# Patient Record
Sex: Female | Born: 1975 | Race: White | Hispanic: No | Marital: Married | Smoking: Never smoker
Health system: Southern US, Community
[De-identification: ages and names within clinical notes are randomized; demographics above are authoritative.]

## PROBLEM LIST (undated history)

## (undated) DIAGNOSIS — D6851 Activated protein C resistance: Secondary | ICD-10-CM

## (undated) DIAGNOSIS — I82409 Acute embolism and thrombosis of unspecified deep veins of unspecified lower extremity: Secondary | ICD-10-CM

## (undated) DIAGNOSIS — Z86018 Personal history of other benign neoplasm: Secondary | ICD-10-CM

## (undated) DIAGNOSIS — K219 Gastro-esophageal reflux disease without esophagitis: Secondary | ICD-10-CM

## (undated) DIAGNOSIS — N879 Dysplasia of cervix uteri, unspecified: Secondary | ICD-10-CM

## (undated) HISTORY — DX: Activated protein C resistance: D68.51

## (undated) HISTORY — PX: WISDOM TOOTH EXTRACTION: SHX21

## (undated) HISTORY — DX: Personal history of other benign neoplasm: Z86.018

## (undated) HISTORY — DX: Acute embolism and thrombosis of unspecified deep veins of unspecified lower extremity: I82.409

## (undated) HISTORY — PX: COLPOSCOPY: SHX161

## (undated) HISTORY — DX: Gastro-esophageal reflux disease without esophagitis: K21.9

## (undated) HISTORY — PX: TONSILLECTOMY: SUR1361

## (undated) HISTORY — PX: KNEE SURGERY: SHX244

## (undated) HISTORY — DX: Dysplasia of cervix uteri, unspecified: N87.9

---
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
## 1997-12-21 ENCOUNTER — Ambulatory Visit (HOSPITAL_COMMUNITY): Admission: RE | Admit: 1997-12-21 | Discharge: 1997-12-21 | Payer: Self-pay | Admitting: Family Medicine

## 1997-12-21 ENCOUNTER — Encounter: Payer: Self-pay | Admitting: Family Medicine

## 2000-02-17 HISTORY — PX: COLPOSCOPY: SHX161

## 2016-05-11 DIAGNOSIS — D485 Neoplasm of uncertain behavior of skin: Secondary | ICD-10-CM | POA: Diagnosis not present

## 2016-05-11 DIAGNOSIS — C4490 Unspecified malignant neoplasm of skin, unspecified: Secondary | ICD-10-CM | POA: Diagnosis not present

## 2016-11-23 DIAGNOSIS — D229 Melanocytic nevi, unspecified: Secondary | ICD-10-CM | POA: Diagnosis not present

## 2016-11-23 DIAGNOSIS — D485 Neoplasm of uncertain behavior of skin: Secondary | ICD-10-CM | POA: Diagnosis not present

## 2017-01-09 ENCOUNTER — Other Ambulatory Visit: Payer: Self-pay

## 2017-01-09 ENCOUNTER — Ambulatory Visit
Admission: EM | Admit: 2017-01-09 | Discharge: 2017-01-09 | Disposition: A | Discharge status: Home / Self Care | Payer: BLUE CROSS/BLUE SHIELD

## 2017-01-09 DIAGNOSIS — L500 Allergic urticaria: Secondary | ICD-10-CM | POA: Diagnosis not present

## 2017-01-09 DIAGNOSIS — T7840XA Allergy, unspecified, initial encounter: Secondary | ICD-10-CM

## 2017-01-09 DIAGNOSIS — L509 Urticaria, unspecified: Secondary | ICD-10-CM

## 2017-01-09 MED ORDER — DEXAMETHASONE SODIUM PHOSPHATE 10 MG/ML IJ SOLN
10.0000 mg | Freq: Once | INTRAMUSCULAR | Status: AC
  Administered 2017-01-09: 10 mg via INTRAMUSCULAR

## 2017-01-09 MED ORDER — PREDNISONE 10 MG PO TABS: 10.0000 mg | ORAL_TABLET | Freq: Every day | ORAL | 0 refills | Status: DC

## 2017-01-09 NOTE — ED Provider Notes (Signed)
[facility]    CSN: [id number] Arrival date & time: 01/09/17  0813     History   Chief Complaint Chief Complaint  Patient presents with  . Allergic Reaction    HPI Jenna Mccarty is a 41 y.o. female 41 year old female presents to the urgent care facility for evaluation of allergic reaction.  Patient states 2 days ago she drank a new flavor of protein shake and developed instant hives on her forehead, cheeks neck and upper extremities.  She also developed more mild rash on the abdomen and lower back.  She denies any facial swelling, difficulty swallowing, chest tightness, wheezing.  No history of anaphylaxis.  Patient has been treating with Benadryl, Zantac but with mild improvement.  She has seen some improvement of the rash on her extremities but not so much on her face.  Rash is not painful, there is mild itching present.   HPI  History reviewed. No pertinent past medical history.  There are no active problems to display for this patient.   Past Surgical History:  Procedure Laterality Date  . KNEE SURGERY    . TONSILLECTOMY      OB History    No data available       Home Medications    Prior to Admission medications   Medication Sig Start Date End Date Taking? Authorizing Provider  norgestimate-ethinyl estradiol (ORTHO-CYCLEN,SPRINTEC,PREVIFEM) 0.25-35 MG-MCG tablet Take 1 tablet by mouth daily.   Yes [provider]  phentermine 30 MG capsule Take 30 mg by mouth every morning.   Yes [provider]  predniSONE (DELTASONE) 10 MG tablet Take 1 tablet (10 mg total) by mouth daily. 6,5,4,3,2,1 six day taper 01/09/17   Jenna Mccarty    Family History History reviewed. No pertinent family history.  Social History Social History   Tobacco Use  . Smoking status: Never Smoker  . Smokeless tobacco: Never Used  Substance Use Topics  . Alcohol use: Yes    Comment: social  . Drug use: No     Allergies    Penicillins   Review of Systems Review of Systems  Constitutional: Negative for fever.  HENT: Negative for drooling, facial swelling, sore throat, trouble swallowing and voice change.   Respiratory: Negative for shortness of breath, wheezing and stridor.   Cardiovascular: Negative for chest pain.  Gastrointestinal: Negative for abdominal pain.  Skin: Positive for rash.  Neurological: Negative for dizziness and headaches.     Physical Exam Triage Vital Signs ED Triage Vitals [01/09/17 0827]  Enc Vitals Group     BP 130/81     Pulse Rate 91     Resp 16     Temp 98.4 F (36.9 C)     Temp Source Oral     SpO2 98 %     Weight 190 lb (86.2 kg)     Height 5\' 5"  (1.651 m)     Head Circumference      Peak Flow      Pain Score      Pain Loc      Pain Edu?      Excl. in [place]?    No data found.  Updated Vital Signs BP 130/81 (BP Location: Left Arm)   Pulse 91   Temp 98.4 F (36.9 C) (Oral)   Resp 16   Ht 5\' 5"  (1.651 m)   Wt 190 lb (86.2 kg)   LMP 12/18/2016   SpO2 98%   BMI 31.62 kg/m  Visual Acuity Right Eye Distance:   Left Eye Distance:   Bilateral Distance:    Right Eye Near:   Left Eye Near:    Bilateral Near:     Physical Exam  Constitutional: She is oriented to person, place, and time. She appears well-developed and well-nourished.  HENT:  Head: Normocephalic and atraumatic.  Right Ear: External ear normal.  Left Ear: External ear normal.  Nose: Nose normal.  Mouth/Throat: Oropharynx is clear and moist. No oropharyngeal exudate.  Eyes: Conjunctivae are normal.  Neck: Normal range of motion.  Cardiovascular: Normal rate and regular rhythm.  Pulmonary/Chest: Effort normal. No respiratory distress. She has no wheezes. She has no rales.  Musculoskeletal: Normal range of motion. She exhibits no edema.  Neurological: She is alert and oriented to person, place, and time.  Skin: Rash noted.  Urticarial rash on forehead, cheeks, neck, and arms. No facial  swelling or signs of angioedema. No intraoral lesions. No rash on palms or soles.   Psychiatric: She has a normal mood and affect. Her behavior is normal. Thought content normal.     UC Treatments / Results  Labs (all labs ordered are listed, but only abnormal results are displayed) Labs Reviewed - No data to display  EKG  EKG Interpretation None       Radiology  No results found.  Procedures Procedures (including critical care time)  Medications Ordered in UC Medications  dexamethasone (DECADRON) injection 10 mg (not administered)     Initial Impression / Assessment and Plan / UC Course  I have reviewed the triage vital signs and the nursing notes.  Pertinent labs & imaging results that were available during my care of the patient were reviewed by me and considered in my medical decision making (see chart for details).     41 year old female with allergic reaction.  No signs of angioedema.  Vital signs are stable.  Exam is unremarkable except for urticarial type rash along the forehead, cheeks as well as the neck.  Patient is given dexamethasone 10 mg IM in the clinic.  She is given a 6-day prednisone taper.  She will continue with Benadryl and Zantac as needed.  She is encouraged to use cortisone cream daily as needed.  She is educated on signs and symptoms return to the clinic for.  Final Clinical Impressions(s) / UC Diagnoses   Final diagnoses:  Allergic reaction, initial encounter  [place]    ED Discharge Orders        Ordered    predniSONE (DELTASONE) 10 MG tablet  Daily     01/09/17 0838          Duanne Guess, PA-C 01/09/17 [phone]

## 2017-01-09 NOTE — Discharge Instructions (Signed)
Continue with Benadryl and Zyrtec.  He may continue with hydrocortisone cream as needed.  Take medications as prescribed.  Return to the urgent care facility for any increasing rash worsening symptoms or any urgent changes in your health.  Follow-up with primary care provider if no improvement of symptoms after 5-6 days.

## 2017-01-09 NOTE — ED Triage Notes (Signed)
Pt reports she thinks she had an allergic reaction to a new flavor of isogenic protein shake on Thursday. Reports hives and "blisters around my mouth". Taking Benadryl which has helped.

## 2017-02-01 DIAGNOSIS — D229 Melanocytic nevi, unspecified: Secondary | ICD-10-CM | POA: Diagnosis not present

## 2017-02-01 DIAGNOSIS — D485 Neoplasm of uncertain behavior of skin: Secondary | ICD-10-CM | POA: Diagnosis not present

## 2017-02-04 ENCOUNTER — Ambulatory Visit (INDEPENDENT_AMBULATORY_CARE_PROVIDER_SITE_OTHER): Payer: BLUE CROSS/BLUE SHIELD | Admitting: Obstetrics and Gynecology

## 2017-02-04 ENCOUNTER — Encounter: Payer: Self-pay | Admitting: Obstetrics and Gynecology

## 2017-02-04 VITALS — BP 124/78 | Ht 65.0 in | Wt 197.0 lb

## 2017-02-04 DIAGNOSIS — Z01419 Encounter for gynecological examination (general) (routine) without abnormal findings: Secondary | ICD-10-CM | POA: Diagnosis not present

## 2017-02-04 DIAGNOSIS — Z3041 Encounter for surveillance of contraceptive pills: Secondary | ICD-10-CM | POA: Diagnosis not present

## 2017-02-04 MED ORDER — NORGESTIMATE-ETH ESTRADIOL 0.25-35 MG-MCG PO TABS: 1.0000 | ORAL_TABLET | Freq: Every day | ORAL | 3 refills | Status: DC

## 2017-02-04 NOTE — Progress Notes (Signed)
PCP:  Ellene Route   Chief Complaint  Patient presents with  . Annual Exam     HPI:      [name] is a 41 y.o. G0P0000 who LMP was Patient's last menstrual period was 01/14/2017., presents today for her annual examination.  Her menses are regular every 28-30 days, lasting 4 days.  Dysmenorrhea none. She does not have intermenstrual bleeding.  Sex activity: single partner, contraception - OCP (estrogen/progesterone).  Last Pap: January 29, 2015  Results were: no abnormalities /neg HPV DNA  Hx of STDs: HPV on pap  There is a FH of breast cancer in her pat grt aunt, genetic testing not indicated. There is no FH of ovarian cancer. The patient does do self-breast exams.  Tobacco use: The patient denies current or previous tobacco use. Alcohol use: social drinker No drug use.  Exercise: very active  She does get adequate calcium and Vitamin D in her diet.  Normal labs 2017.  Past Medical History:  Diagnosis Date  . Cervical dysplasia     Past Surgical History:  Procedure Laterality Date  . COLPOSCOPY    . KNEE SURGERY    . TONSILLECTOMY    . WISDOM TOOTH EXTRACTION      Family History  Problem Relation Age of Onset  . Diabetes Mellitus II Mother   . Diabetes Mellitus II Maternal Grandfather   . Diabetes Mellitus II Paternal Grandmother     Social History   Socioeconomic History  . Marital status: Single    Spouse name: Not on file  . Number of children: Not on file  . Years of education: Not on file  . Highest education level: Not on file  Social Needs  . Financial resource strain: Not on file  . Food insecurity - worry: Not on file  . Food insecurity - inability: Not on file  . Transportation needs - medical: Not on file  . Transportation needs - non-medical: Not on file  Occupational History  . Not on file  Tobacco Use  . Smoking status: Never Smoker  . Smokeless tobacco: Never Used  Substance and Sexual Activity  .  Alcohol use: Yes    Comment: social  . Drug use: No  . Sexual activity: Yes    Birth control/protection: Pill  Other Topics Concern  . Not on file  Social History Narrative  . Not on file    Current Meds  Medication Sig  . norgestimate-ethinyl estradiol (ORTHO-CYCLEN,SPRINTEC,PREVIFEM) 0.25-35 MG-MCG tablet Take 1 tablet by mouth daily.  . phentermine 30 MG capsule Take 30 mg by mouth every morning.  . [DISCONTINUED] norgestimate-ethinyl estradiol (ORTHO-CYCLEN,SPRINTEC,PREVIFEM) 0.25-35 MG-MCG tablet Take 1 tablet by mouth daily.     ROS:  Review of Systems  Constitutional: Negative for fatigue, fever and unexpected weight change.  Respiratory: Negative for cough, shortness of breath and wheezing.   Cardiovascular: Negative for chest pain, palpitations and leg swelling.  Gastrointestinal: Negative for blood in stool, constipation, diarrhea, nausea and vomiting.  Endocrine: Negative for cold intolerance, heat intolerance and polyuria.  Genitourinary: Negative for dyspareunia, dysuria, flank pain, frequency, genital sores, hematuria, menstrual problem, pelvic pain, urgency, vaginal bleeding, vaginal discharge and vaginal pain.  Musculoskeletal: Negative for back pain, joint swelling and myalgias.  Skin: Negative for rash.  Neurological: Negative for dizziness, syncope, light-headedness, numbness and headaches.  Hematological: Negative for adenopathy.  Psychiatric/Behavioral: Negative for agitation, confusion, sleep disturbance and suicidal ideas. The patient is not nervous/anxious.  Objective: BP 124/78   Ht 5\' 5"  (1.651 m)   Wt 197 lb (89.4 kg)   LMP 01/14/2017   BMI 32.78 kg/m    Physical Exam  Constitutional: She is oriented to person, place, and time. She appears well-developed and well-nourished.  Genitourinary: Vagina normal and uterus normal. There is no rash or tenderness on the right labia. There is no rash or tenderness on the left labia. No erythema or  tenderness in the vagina. No vaginal discharge found. Right adnexum does not display mass and does not display tenderness. Left adnexum does not display mass and does not display tenderness. Cervix does not exhibit motion tenderness or polyp. Uterus is not enlarged or tender.  Neck: Normal range of motion. No thyromegaly present.  Cardiovascular: Normal rate, regular rhythm and normal heart sounds.  No murmur heard. Pulmonary/Chest: Effort normal and breath sounds normal. Right breast exhibits no mass, no nipple discharge, no skin change and no tenderness. Left breast exhibits no mass, no nipple discharge, no skin change and no tenderness.  Abdominal: Soft. There is no tenderness. There is no guarding.  Musculoskeletal: Normal range of motion.  Neurological: She is alert and oriented to person, place, and time. No cranial nerve deficit.  Psychiatric: She has a normal mood and affect. Her behavior is normal.  Vitals reviewed.   Assessment/Plan: Encounter for annual routine gynecological examination  Encounter for surveillance of contraceptive pills - OCP RF - Plan: norgestimate-ethinyl estradiol (ORTHO-CYCLEN,SPRINTEC,PREVIFEM) 0.25-35 MG-MCG tablet  Meds ordered this encounter  Medications  . norgestimate-ethinyl estradiol (ORTHO-CYCLEN,SPRINTEC,PREVIFEM) 0.25-35 MG-MCG tablet    Sig: Take 1 tablet by mouth daily.    Dispense:  3 Package    Refill:  3             GYN counsel adequate intake of calcium and vitamin D, diet and exercise     F/U  Return in about 1 year (around 02/04/2018).  Ife Vitelli B. Avraj Lindroth, PA-C 02/04/2017 8:54 AM

## 2017-02-04 NOTE — Patient Instructions (Signed)
I value your feedback and entrusting us with your care. If you get a [facility] patient survey, I would appreciate you taking the time to let us know about your experience today. Thank you! 

## 2017-02-07 ENCOUNTER — Other Ambulatory Visit: Payer: Self-pay | Admitting: Obstetrics and Gynecology

## 2017-04-07 DIAGNOSIS — R21 Rash and other nonspecific skin eruption: Secondary | ICD-10-CM | POA: Diagnosis not present

## 2017-04-07 DIAGNOSIS — J301 Allergic rhinitis due to pollen: Secondary | ICD-10-CM | POA: Diagnosis not present

## 2017-04-07 DIAGNOSIS — L309 Dermatitis, unspecified: Secondary | ICD-10-CM | POA: Diagnosis not present

## 2017-04-07 DIAGNOSIS — K9049 Malabsorption due to intolerance, not elsewhere classified: Secondary | ICD-10-CM | POA: Diagnosis not present

## 2017-08-26 DIAGNOSIS — H9190 Unspecified hearing loss, unspecified ear: Secondary | ICD-10-CM | POA: Diagnosis not present

## 2017-08-26 DIAGNOSIS — H6123 Impacted cerumen, bilateral: Secondary | ICD-10-CM | POA: Diagnosis not present

## 2017-12-20 DIAGNOSIS — Z1283 Encounter for screening for malignant neoplasm of skin: Secondary | ICD-10-CM | POA: Diagnosis not present

## 2017-12-20 DIAGNOSIS — Z808 Family history of malignant neoplasm of other organs or systems: Secondary | ICD-10-CM | POA: Diagnosis not present

## 2017-12-20 DIAGNOSIS — D485 Neoplasm of uncertain behavior of skin: Secondary | ICD-10-CM | POA: Diagnosis not present

## 2017-12-20 DIAGNOSIS — L812 Freckles: Secondary | ICD-10-CM | POA: Diagnosis not present

## 2017-12-25 ENCOUNTER — Other Ambulatory Visit: Payer: Self-pay | Admitting: Obstetrics and Gynecology

## 2018-01-10 ENCOUNTER — Other Ambulatory Visit: Payer: Self-pay | Admitting: Obstetrics and Gynecology

## 2018-01-13 ENCOUNTER — Encounter: Payer: Self-pay | Admitting: Obstetrics and Gynecology

## 2018-01-17 ENCOUNTER — Other Ambulatory Visit: Payer: Self-pay

## 2018-01-17 MED ORDER — NORGESTIM-ETH ESTRAD TRIPHASIC 0.18/0.215/0.25 MG-35 MCG PO TABS: 1.0000 | ORAL_TABLET | Freq: Every day | ORAL | 0 refills | Status: DC

## 2018-01-27 DIAGNOSIS — J0120 Acute ethmoidal sinusitis, unspecified: Secondary | ICD-10-CM | POA: Diagnosis not present

## 2018-02-22 NOTE — Progress Notes (Signed)
PCP:  Ellene Route   Chief Complaint  Patient presents with  . Gynecologic Exam     HPI:      [name] is a 43 y.o. G0P0000 who LMP was Patient's last menstrual period was 02/10/2018 (exact date)., presents today for her annual examination.  Her menses are regular every 28-30 days, lasting 4 days.  Dysmenorrhea none. She does not have intermenstrual bleeding.  Sex activity: single partner, contraception - OCP (estrogen/progesterone).  Last Pap: January 29, 2015  Results were: no abnormalities /neg HPV DNA  Hx of STDs: HPV on pap  Mammogram: not recent There is a FH of breast cancer in her pat grt aunt, genetic testing not indicated. There is no FH of ovarian cancer. The patient does do self-breast exams.  Tobacco use: The patient denies current or previous tobacco use. Alcohol use: social drinker No drug use.  Exercise: very active  She does get adequate calcium and Vitamin D in her diet.  Normal labs 2017. Due for fasting labs this yr.  Past Medical History:  Diagnosis Date  . Cervical dysplasia     Past Surgical History:  Procedure Laterality Date  . COLPOSCOPY    . KNEE SURGERY    . TONSILLECTOMY    . WISDOM TOOTH EXTRACTION      Family History  Problem Relation Age of Onset  . Diabetes Mellitus II Mother   . Diabetes Mellitus II Maternal Grandfather   . Diabetes Mellitus II Paternal Grandmother   . Cancer Other 50       Breast    Social History   Socioeconomic History  . Marital status: Married    Spouse name: Not on file  . Number of children: Not on file  . Years of education: Not on file  . Highest education level: Not on file  Occupational History  . Not on file  Social Needs  . Financial resource strain: Not on file  . Food insecurity:    Worry: Not on file    Inability: Not on file  . Transportation needs:    Medical: Not on file    Non-medical: Not on file  Tobacco Use  . Smoking status: Never Smoker  .  Smokeless tobacco: Never Used  Substance and Sexual Activity  . Alcohol use: Yes    Comment: social  . Drug use: No  . Sexual activity: Yes    Birth control/protection: Pill  Lifestyle  . Physical activity:    Days per week: Not on file    Minutes per session: Not on file  . Stress: Not on file  Relationships  . Social connections:    Talks on phone: Not on file    Gets together: Not on file    Attends religious service: Not on file    Active member of club or organization: Not on file    Attends meetings of clubs or organizations: Not on file    Relationship status: Not on file  . Intimate partner violence:    Fear of current or ex partner: Not on file    Emotionally abused: Not on file    Physically abused: Not on file    Forced sexual activity: Not on file  Other Topics Concern  . Not on file  Social History Narrative  . Not on file    Current Meds  Medication Sig  . cetirizine (ZYRTEC) 10 MG tablet Take by mouth.  . diphenhydrAMINE (BENADRYL) 50 MG capsule Take by  mouth.  . Multiple Vitamins-Minerals (MULTIVITAMIN PO) Take by mouth.  Marland Kitchen NATURAL PSYLLIUM SEED PO Take by mouth.  . norgestimate-ethinyl estradiol (ORTHO-CYCLEN,SPRINTEC,PREVIFEM) 0.25-35 MG-MCG tablet Take 1 tablet by mouth daily.  Marland Kitchen omeprazole (PRILOSEC) 20 MG capsule Take by mouth.  . [DISCONTINUED] norgestimate-ethinyl estradiol (ORTHO-CYCLEN,SPRINTEC,PREVIFEM) 0.25-35 MG-MCG tablet Take 1 tablet by mouth daily.     ROS:  Review of Systems  Constitutional: Negative for fatigue, fever and unexpected weight change.  Respiratory: Negative for cough, shortness of breath and wheezing.   Cardiovascular: Negative for chest pain, palpitations and leg swelling.  Gastrointestinal: Negative for blood in stool, constipation, diarrhea, nausea and vomiting.  Endocrine: Negative for cold intolerance, heat intolerance and polyuria.  Genitourinary: Negative for dyspareunia, dysuria, flank pain, frequency, genital  sores, hematuria, menstrual problem, pelvic pain, urgency, vaginal bleeding, vaginal discharge and vaginal pain.  Musculoskeletal: Negative for back pain, joint swelling and myalgias.  Skin: Negative for rash.  Neurological: Negative for dizziness, syncope, light-headedness, numbness and headaches.  Hematological: Negative for adenopathy.  Psychiatric/Behavioral: Negative for agitation, confusion, sleep disturbance and suicidal ideas. The patient is not nervous/anxious.      Objective: BP 110/86   Pulse 76   Ht 5\' 5"  (1.651 m)   Wt 218 lb (98.9 kg)   LMP 02/10/2018 (Exact Date)   BMI 36.28 kg/m    Physical Exam Constitutional:      Appearance: She is well-developed.  Genitourinary:     Vulva, vagina, uterus, right adnexa and left adnexa normal.     No vulval lesion or tenderness noted.     No vaginal discharge, erythema or tenderness.     No cervical motion tenderness or polyp.     Uterus is not enlarged or tender.     No right or left adnexal mass present.     Right adnexa not tender.     Left adnexa not tender.  Neck:     Musculoskeletal: Normal range of motion.     Thyroid: No thyromegaly.  Cardiovascular:     Rate and Rhythm: Normal rate and regular rhythm.     Heart sounds: Normal heart sounds. No murmur.  Pulmonary:     Effort: Pulmonary effort is normal.     Breath sounds: Normal breath sounds.  Chest:     Breasts:        Right: No mass, nipple discharge, skin change or tenderness.        Left: No mass, nipple discharge, skin change or tenderness.  Abdominal:     Palpations: Abdomen is soft.     Tenderness: There is no abdominal tenderness. There is no guarding.  Musculoskeletal: Normal range of motion.  Neurological:     Mental Status: She is alert and oriented to person, place, and time.     Cranial Nerves: No cranial nerve deficit.  Psychiatric:        Behavior: Behavior normal.  Vitals signs reviewed.     Assessment/Plan: Encounter for annual  routine gynecological examination  Cervical cancer screening - Plan: Cytology - PAP  Screening for HPV (human papillomavirus) - Plan: Cytology - PAP  Screening for breast cancer - Pt to sched mammo - Plan: MM 3D SCREEN BREAST BILATERAL  Encounter for surveillance of contraceptive pills - OCP RF - Plan: norgestimate-ethinyl estradiol (ORTHO-CYCLEN,SPRINTEC,PREVIFEM) 0.25-35 MG-MCG tablet  Blood tests for routine general physical examination - Plan: Comprehensive metabolic panel, Lipid panel, Hemoglobin A1c  Screening cholesterol level - Plan: Lipid panel  Screening for diabetes mellitus - Plan:  Hemoglobin A1c  Meds ordered this encounter  Medications  . norgestimate-ethinyl estradiol (ORTHO-CYCLEN,SPRINTEC,PREVIFEM) 0.25-35 MG-MCG tablet    Sig: Take 1 tablet by mouth daily.    Dispense:  3 Package    Refill:  3    Order Specific Question:   Supervising Provider    Answer:   Gae Dry [[id number]]             GYN counsel adequate intake of calcium and vitamin D, diet and exercise     F/U  Return in about 1 year (around 02/24/2019).  Michoel Kunin B. Lorelei Heikkila, PA-C 02/23/2018 9:02 AM

## 2018-02-23 ENCOUNTER — Ambulatory Visit (INDEPENDENT_AMBULATORY_CARE_PROVIDER_SITE_OTHER): Payer: BLUE CROSS/BLUE SHIELD | Admitting: Obstetrics and Gynecology

## 2018-02-23 ENCOUNTER — Encounter: Payer: Self-pay | Admitting: Obstetrics and Gynecology

## 2018-02-23 ENCOUNTER — Other Ambulatory Visit (HOSPITAL_COMMUNITY)
Admission: RE | Admit: 2018-02-23 | Discharge: 2018-02-23 | Disposition: A | Discharge status: Home / Self Care | Payer: BLUE CROSS/BLUE SHIELD | Source: Ambulatory Visit | Attending: Obstetrics and Gynecology | Admitting: Obstetrics and Gynecology

## 2018-02-23 VITALS — BP 110/86 | HR 76 | Ht 65.0 in | Wt 218.0 lb

## 2018-02-23 DIAGNOSIS — Z124 Encounter for screening for malignant neoplasm of cervix: Secondary | ICD-10-CM | POA: Insufficient documentation

## 2018-02-23 DIAGNOSIS — Z1151 Encounter for screening for human papillomavirus (HPV): Secondary | ICD-10-CM | POA: Insufficient documentation

## 2018-02-23 DIAGNOSIS — Z13220 Encounter for screening for lipoid disorders: Secondary | ICD-10-CM

## 2018-02-23 DIAGNOSIS — Z01419 Encounter for gynecological examination (general) (routine) without abnormal findings: Secondary | ICD-10-CM

## 2018-02-23 DIAGNOSIS — Z131 Encounter for screening for diabetes mellitus: Secondary | ICD-10-CM

## 2018-02-23 DIAGNOSIS — Z1239 Encounter for other screening for malignant neoplasm of breast: Secondary | ICD-10-CM

## 2018-02-23 DIAGNOSIS — Z0000 Encounter for general adult medical examination without abnormal findings: Secondary | ICD-10-CM

## 2018-02-23 DIAGNOSIS — Z3041 Encounter for surveillance of contraceptive pills: Secondary | ICD-10-CM

## 2018-02-23 MED ORDER — NORGESTIMATE-ETH ESTRADIOL 0.25-35 MG-MCG PO TABS: 1.0000 | ORAL_TABLET | Freq: Every day | ORAL | 3 refills | Status: DC

## 2018-02-23 NOTE — Patient Instructions (Signed)
I value your feedback and entrusting us with your care. If you get a [place] patient survey, I would appreciate you taking the time to let us know about your experience today. Thank you!  [facility]: [phone]  [facility]: [phone]  

## 2018-02-25 ENCOUNTER — Encounter: Payer: Self-pay | Admitting: Obstetrics and Gynecology

## 2018-02-25 LAB — CYTOLOGY - PAP
Adequacy: ABSENT
Diagnosis: NEGATIVE
HPV (WINDOPATH): NOT DETECTED

## 2018-03-09 ENCOUNTER — Ambulatory Visit
Admission: RE | Admit: 2018-03-09 | Discharge: 2018-03-09 | Disposition: A | Discharge status: Home / Self Care | Payer: BLUE CROSS/BLUE SHIELD | Source: Ambulatory Visit | Attending: Obstetrics and Gynecology | Admitting: Obstetrics and Gynecology

## 2018-03-09 DIAGNOSIS — Z1231 Encounter for screening mammogram for malignant neoplasm of breast: Secondary | ICD-10-CM | POA: Diagnosis not present

## 2018-03-09 DIAGNOSIS — Z1239 Encounter for other screening for malignant neoplasm of breast: Secondary | ICD-10-CM | POA: Insufficient documentation

## 2018-04-08 ENCOUNTER — Other Ambulatory Visit: Payer: Self-pay | Admitting: Obstetrics and Gynecology

## 2018-08-04 DIAGNOSIS — Z1159 Encounter for screening for other viral diseases: Secondary | ICD-10-CM | POA: Diagnosis not present

## 2018-08-31 ENCOUNTER — Encounter: Payer: Self-pay | Admitting: Obstetrics and Gynecology

## 2018-08-31 ENCOUNTER — Ambulatory Visit: Payer: BC Managed Care – PPO | Admitting: Obstetrics and Gynecology

## 2018-08-31 ENCOUNTER — Other Ambulatory Visit: Payer: Self-pay

## 2018-08-31 VITALS — BP 120/90 | Ht 65.0 in | Wt 219.6 lb

## 2018-08-31 DIAGNOSIS — B354 Tinea corporis: Secondary | ICD-10-CM | POA: Diagnosis not present

## 2018-08-31 MED ORDER — CLOTRIMAZOLE-BETAMETHASONE 1-0.05 % EX CREA: TOPICAL_CREAM | CUTANEOUS | 0 refills | Status: DC

## 2018-08-31 NOTE — Progress Notes (Signed)
[facility]   Chief Complaint  Patient presents with  . Breast Exam    noticed "pimple size" spot on left breast on 6/29, its been itchy and now it is red and larger, no breast pain or pain on spot    HPI:      [name] is a 43 y.o. G0P0000 who LMP was Patient's last menstrual period was 08/25/2018 (exact date)., presents today for LT breast skin lesion. Area started about 2 wks ago and is getting larger. Very itchy. No trauma to area, no nipple d/c. No breast masses. Treated with benadryl crm yesterday with some relief.  Neg mammo 03/09/18. FH breast cancer in pat grt aunt.  Past Medical History:  Diagnosis Date  . Cervical dysplasia     Past Surgical History:  Procedure Laterality Date  . COLPOSCOPY    . KNEE SURGERY    . TONSILLECTOMY    . WISDOM TOOTH EXTRACTION      Family History  Problem Relation Age of Onset  . Diabetes Mellitus II Mother   . Diabetes Mellitus II Maternal Grandfather   . Diabetes Mellitus II Paternal Grandmother   . Cancer Other 36       Breast  . Breast cancer Paternal Aunt        pat great aunt    Social History   Socioeconomic History  . Marital status: Married    Spouse name: Not on file  . Number of children: Not on file  . Years of education: Not on file  . Highest education level: Not on file  Occupational History  . Not on file  Social Needs  . Financial resource strain: Not on file  . Food insecurity    Worry: Not on file    Inability: Not on file  . Transportation needs    Medical: Not on file    Non-medical: Not on file  Tobacco Use  . Smoking status: Never Smoker  . Smokeless tobacco: Never Used  Substance and Sexual Activity  . Alcohol use: Yes    Comment: social  . Drug use: No  . Sexual activity: Yes    Birth control/protection: Pill  Lifestyle  . Physical activity    Days per week: Not on file    Minutes per session: Not on file  . Stress: Not on file  Relationships  .  Social Herbalist on phone: Not on file    Gets together: Not on file    Attends religious service: Not on file    Active member of club or organization: Not on file    Attends meetings of clubs or organizations: Not on file    Relationship status: Not on file  . Intimate partner violence    Fear of current or ex partner: Not on file    Emotionally abused: Not on file    Physically abused: Not on file    Forced sexual activity: Not on file  Other Topics Concern  . Not on file  Social History Narrative  . Not on file    Outpatient Medications Prior to Visit  Medication Sig Dispense Refill  . cetirizine (ZYRTEC) 10 MG tablet Take by mouth.    . Multiple Vitamins-Minerals (MULTIVITAMIN PO) Take by mouth.    . norgestimate-ethinyl estradiol (ORTHO-CYCLEN,SPRINTEC,PREVIFEM) 0.25-35 MG-MCG tablet Take 1 tablet by mouth daily. 3 Package 3  . omeprazole (PRILOSEC) 20 MG capsule Take by mouth.    . diphenhydrAMINE (BENADRYL)  50 MG capsule Take by mouth.    Marland Kitchen NATURAL PSYLLIUM SEED PO Take by mouth.     No facility-administered medications prior to visit.       ROS:  Review of Systems  Constitutional: Negative for fever.  Gastrointestinal: Negative for blood in stool, constipation, diarrhea, nausea and vomiting.  Genitourinary: Negative for dyspareunia, dysuria, flank pain, frequency, hematuria, urgency, vaginal bleeding, vaginal discharge and vaginal pain.  Musculoskeletal: Negative for back pain.  Skin: Positive for color change and rash.   BREAST: itch, rash   OBJECTIVE:   Vitals:  BP 120/90   Ht 5\' 5"  (1.651 m)   Wt 219 lb 9.6 oz (99.6 kg)   LMP 08/25/2018 (Exact Date)   BMI 36.54 kg/m   Physical Exam Vitals signs reviewed.  Neck:     Musculoskeletal: Normal range of motion.  Pulmonary:     Effort: Pulmonary effort is normal.  Chest:     Breasts: Breasts are symmetrical.        Right: No inverted nipple, mass, nipple discharge, skin change or  tenderness.        Left: Skin change present. No inverted nipple, mass, nipple discharge or tenderness.    Musculoskeletal: Normal range of motion.  Skin:    General: Skin is warm and dry.  Neurological:     General: No focal deficit present.     Mental Status: She is alert and oriented to person, place, and time.     Cranial Nerves: No cranial nerve deficit.  Psychiatric:        Mood and Affect: Mood normal.        Behavior: Behavior normal.        Thought Content: Thought content normal.        Judgment: Judgment normal.     Assessment/Plan: Tinea corporis - Plan: clotrimazole-betamethasone (LOTRISONE) cream, LT breast. Rx lotrisone crm. Keep area dry. F/u if no improvement in 2-4 wks for further eval prn.   Meds ordered this encounter  Medications  . clotrimazole-betamethasone (LOTRISONE) cream    Sig: Apply externally BID for 2-4 wks    Dispense:  15 g    Refill:  0    Order Specific Question:   Supervising Provider    Answer:   Gae Dry [[id number]]      Return if symptoms worsen or fail to improve.  Alicia B. Copland, PA-C 08/31/2018 4:42 PM

## 2018-08-31 NOTE — Patient Instructions (Signed)
I value your feedback and entrusting us with your care. If you get a [place] patient survey, I would appreciate you taking the time to let us know about your experience today. Thank you! 

## 2018-10-18 DIAGNOSIS — H9190 Unspecified hearing loss, unspecified ear: Secondary | ICD-10-CM | POA: Diagnosis not present

## 2018-10-18 DIAGNOSIS — H6123 Impacted cerumen, bilateral: Secondary | ICD-10-CM | POA: Diagnosis not present

## 2018-10-28 ENCOUNTER — Ambulatory Visit
Admission: EM | Admit: 2018-10-28 | Discharge: 2018-10-28 | Disposition: A | Discharge status: Home / Self Care | Payer: BC Managed Care – PPO | Attending: Urgent Care | Admitting: Urgent Care

## 2018-10-28 ENCOUNTER — Other Ambulatory Visit: Payer: Self-pay

## 2018-10-28 DIAGNOSIS — N3001 Acute cystitis with hematuria: Secondary | ICD-10-CM

## 2018-10-28 LAB — URINALYSIS, COMPLETE (UACMP) WITH MICROSCOPIC
Bacteria, UA: NONE SEEN
Bilirubin Urine: NEGATIVE
Glucose, UA: NEGATIVE mg/dL
Ketones, ur: NEGATIVE mg/dL
Leukocytes,Ua: NEGATIVE
Nitrite: NEGATIVE
Protein, ur: NEGATIVE mg/dL
Specific Gravity, Urine: 1.01 (ref 1.005–1.030)
Squamous Epithelial / HPF: NONE SEEN (ref 0–5)
pH: 7 (ref 5.0–8.0)

## 2018-10-28 MED ORDER — PHENAZOPYRIDINE HCL 200 MG PO TABS: 200.0000 mg | ORAL_TABLET | Freq: Three times a day (TID) | ORAL | 0 refills | Status: DC | PRN

## 2018-10-28 MED ORDER — SULFAMETHOXAZOLE-TRIMETHOPRIM 800-160 MG PO TABS: 1.0000 | ORAL_TABLET | Freq: Two times a day (BID) | ORAL | 0 refills | Status: AC

## 2018-10-28 NOTE — ED Provider Notes (Signed)
[place], [place]   Name: Jenna Mccarty DOB: 06/01/1975 MRN: [id number] CSN: [id number] PCP: Ellene Route  Arrival date and time:  10/28/18 0852  Chief Complaint:  Hematuria   NOTE: Prior to seeing the patient today, I have reviewed the triage nursing documentation and vital signs. Clinical staff has updated patient's PMH/PSHx, current medication list, and drug allergies/intolerances to ensure comprehensive history available to assist in medical decision making.   History:   HPI: Jenna Mccarty is a 43 y.o. female who presents today with complaints of urinary symptoms that began with acute onset yesterday. She complains of dysuria, frequency, and urgency. She has appreciated gross hematuria, and advised that her urine was "very red red" last night. Patient increased fluid intake and consumed a half of a bottle of cranberry juice, which lighted the color of her urine to a "light pink" this morning with her initial void.  Patient advising that her urine has had a "funny smell" for the last 2-3 days. Patient denies any associated nausea, vomiting, fever, and chills. She had some minor pain in her lower back early this morning, but none in her flank or abdomen. Patient advises that she does not have a significant history for recurrent urinary tract infections. She denies any vaginal pain, bleeding, or discharge. Patient's last menstrual period was 10/20/2018. There are no concerns that she is currently pregnant.   Past Medical History:  Diagnosis Date  . Cervical dysplasia     Past Surgical History:  Procedure Laterality Date  . COLPOSCOPY    . KNEE SURGERY    . TONSILLECTOMY    . WISDOM TOOTH EXTRACTION      Family History  Problem Relation Age of Onset  . Diabetes Mellitus II Mother   . Diabetes Mellitus II Maternal Grandfather   . Diabetes Mellitus II Paternal Grandmother   . Cancer Other 52       Breast  . Breast cancer Paternal Aunt        pat great  aunt    Social History   Tobacco Use  . Smoking status: Never Smoker  . Smokeless tobacco: Never Used  Substance Use Topics  . Alcohol use: Yes    Comment: social  . Drug use: No    There are no active problems to display for this patient.   Home Medications:    Current Meds  Medication Sig  . cetirizine (ZYRTEC) 10 MG tablet Take by mouth.  . lactobacillus acidophilus (BACID) TABS tablet Take 2 tablets by mouth 3 (three) times daily.  . Multiple Vitamins-Minerals (MULTIVITAMIN PO) Take by mouth.  . norgestimate-ethinyl estradiol (ORTHO-CYCLEN,SPRINTEC,PREVIFEM) 0.25-35 MG-MCG tablet Take 1 tablet by mouth daily.  Marland Kitchen omeprazole (PRILOSEC) 20 MG capsule Take by mouth.    Allergies:   Penicillins  Review of Systems (ROS): Review of Systems  Constitutional: Negative for chills and fever.  Respiratory: Negative for cough and shortness of breath.   Cardiovascular: Negative for chest pain and palpitations.  Gastrointestinal: Negative for abdominal pain, nausea and vomiting.  Genitourinary: Positive for dysuria, frequency, hematuria and urgency. Negative for flank pain, vaginal bleeding, vaginal discharge and vaginal pain.  Musculoskeletal: Positive for back pain.  Neurological: Negative for dizziness, syncope, weakness and numbness.  All other systems reviewed and are negative.    Vital Signs: Today's Vitals   10/28/18 0911 10/28/18 0912 10/28/18 0913 10/28/18 0952  BP:   124/80   Pulse:   77   Resp:   18   Temp:  98.5 F (36.9 C)   TempSrc:   Oral   SpO2:   100%   Weight:  210 lb (95.3 kg)    Height:  5\' 5"  (1.651 m)    PainSc: 4    4     Physical Exam: Physical Exam  Constitutional: She is oriented to person, place, and time and well-developed, well-nourished, and in no distress.  HENT:  Head: Normocephalic and atraumatic.  Mouth/Throat: Mucous membranes are normal.  Eyes: Pupils are equal, round, and reactive to light.  Neck: Normal range of motion. Neck  supple. No tracheal deviation present.  Cardiovascular: Normal rate.  Pulmonary/Chest: Effort normal. No respiratory distress.  Abdominal: Soft. Normal appearance and bowel sounds are normal. There is no abdominal tenderness. There is no CVA tenderness.  Neurological: She is alert and oriented to person, place, and time. Gait normal.  Skin: Skin is warm and dry. No rash noted.  Psychiatric: Mood, memory, affect and judgment normal.  Nursing note and vitals reviewed.   Urgent Care Treatments / Results:   LABS: PLEASE NOTE: all labs that were ordered this encounter are listed, however only abnormal results are displayed. Labs Reviewed  URINALYSIS, COMPLETE (UACMP) WITH MICROSCOPIC - Abnormal; Notable for the following components:      Result Value   Color, Urine STRAW (*)    Hgb urine dipstick LARGE (*)    All other components within normal limits  URINE CULTURE    EKG: -None  RADIOLOGY: No results found.  PROCEDURES: Procedures  MEDICATIONS RECEIVED THIS VISIT: Medications - No data to display  PERTINENT CLINICAL COURSE NOTES/UPDATES:   Initial Impression / Assessment and Plan / Urgent Care Course:  Pertinent labs & imaging results that were available during my care of the patient were personally reviewed by me and considered in my medical decision making (see lab/imaging section of note for values and interpretations).  Jenna Mccarty is a 43 y.o. female who presents to [facility] today with complaints of Hematuria   Patient is well appearing overall in clinic today. She does not appear to be in any acute distress. Presenting symptoms (see HPI) and exam as documented above. UA negative for infection, however there is a large amount of microscopic blood present in the sample. Sample sent for reflex culture. Discussed likely early cystitis. Patient encouraged to increase her fluid intake as much as possible. Discussed that water is always best to flush the  urinary tract. She was advised to avoid caffeine containing fluids until her infections clears, as caffeine can cause her to experience painful bladder spasms. May use Tylenol and/or Ibuprofen as needed for pain/fever. Will also send PRN supply of phenazopyridine to help relieve her current urinary pain. Patient concerned about progression of symptoms over the weekend. Will provide a provisional (wait and see) prescription for a 3 day course of SMZ-TMP DS for use for symptom progression.   Discussed follow up with primary care physician in 1 week for re-evaluation. I have reviewed the follow up and strict return precautions for any new or worsening symptoms. Patient is aware of symptoms that would be deemed urgent/emergent, and would thus require further evaluation either here or in the emergency department. At the time of discharge, she verbalized understanding and consent with the discharge plan as it was reviewed with her. All questions were fielded by provider and/or clinic staff prior to patient discharge.    Final Clinical Impressions / Urgent Care Diagnoses:   Final diagnoses:  Acute cystitis  with hematuria    New Prescriptions:  [place] Controlled Substance Registry consulted? Not Applicable  Meds ordered this encounter  Medications  . phenazopyridine (PYRIDIUM) 200 MG tablet    Sig: Take 1 tablet (200 mg total) by mouth 3 (three) times daily as needed for pain.    Dispense:  9 tablet    Refill:  0  . sulfamethoxazole-trimethoprim (BACTRIM DS) 800-160 MG tablet    Sig: Take 1 tablet by mouth 2 (two) times daily for 3 days.    Dispense:  6 tablet    Refill:  0    Recommended Follow up Care:  Patient encouraged to follow up with the following provider within the specified time frame, or sooner as dictated by the severity of her symptoms. As always, she was instructed that for any urgent/emergent care needs, she should seek care either here or in the emergency department for more immediate  evaluation.  Follow-up Information    [facility] In 1 week.   Why: General reassessment of symptoms if not improving Contact information: [address] [place] [address] [phone]         NOTE: This note was prepared using Dragon dictation software along with smaller phrase technology. Despite my best ability to proofread, there is the potential that transcriptional errors may still occur from this process, and are completely unintentional.    Karen Kitchens, NP 10/28/18 [phone]

## 2018-10-28 NOTE — ED Triage Notes (Signed)
Patient complains of hematuria, urinary frequency and painful urination that started last night states that she has been at the beach last week.

## 2018-10-28 NOTE — Discharge Instructions (Addendum)
It was very nice seeing you today in clinic. Thank you for entrusting me with your care.   Increase fluid intake. Please utilize the medications that we discussed. Your prescriptions have been called in to your pharmacy.   Make arrangements to follow up with your regular doctor in 1 week for re-evaluation if not improving. If your symptoms/condition worsens, please seek follow up care either here or in the ER. Please remember, our [place] providers are "right here with you" when you need [place].   Again, it was my pleasure to take care of you today. Thank you for choosing our clinic. I hope that you start to feel better quickly.   Honor Loh, MSN, APRN, FNP-C, CEN Advanced Practice Provider [facility]

## 2018-10-30 LAB — URINE CULTURE: Culture: 10000 — AB

## 2018-10-31 ENCOUNTER — Telehealth (HOSPITAL_COMMUNITY): Payer: Self-pay | Admitting: Emergency Medicine

## 2018-10-31 NOTE — Telephone Encounter (Signed)
Treated with bactrim, only 10,000 colonies. Attempted to reach patient, no answer.

## 2018-12-26 DIAGNOSIS — D224 Melanocytic nevi of scalp and neck: Secondary | ICD-10-CM | POA: Diagnosis not present

## 2018-12-26 DIAGNOSIS — D225 Melanocytic nevi of trunk: Secondary | ICD-10-CM | POA: Diagnosis not present

## 2018-12-26 DIAGNOSIS — Z86018 Personal history of other benign neoplasm: Secondary | ICD-10-CM | POA: Diagnosis not present

## 2018-12-26 DIAGNOSIS — D2230 Melanocytic nevi of unspecified part of face: Secondary | ICD-10-CM | POA: Diagnosis not present

## 2019-01-22 ENCOUNTER — Other Ambulatory Visit: Payer: Self-pay | Admitting: Obstetrics and Gynecology

## 2019-01-22 DIAGNOSIS — Z3041 Encounter for surveillance of contraceptive pills: Secondary | ICD-10-CM

## 2019-01-22 MED ORDER — NORGESTIMATE-ETH ESTRADIOL 0.25-35 MG-MCG PO TABS: 1.0000 | ORAL_TABLET | Freq: Every day | ORAL | 0 refills | Status: DC

## 2019-01-23 ENCOUNTER — Telehealth: Payer: Self-pay | Admitting: Obstetrics and Gynecology

## 2019-01-23 ENCOUNTER — Other Ambulatory Visit: Payer: Self-pay | Admitting: Obstetrics and Gynecology

## 2019-01-23 DIAGNOSIS — Z131 Encounter for screening for diabetes mellitus: Secondary | ICD-10-CM

## 2019-01-23 DIAGNOSIS — Z13220 Encounter for screening for lipoid disorders: Secondary | ICD-10-CM

## 2019-01-23 DIAGNOSIS — Z0000 Encounter for general adult medical examination without abnormal findings: Secondary | ICD-10-CM

## 2019-01-23 DIAGNOSIS — Z3041 Encounter for surveillance of contraceptive pills: Secondary | ICD-10-CM

## 2019-01-23 NOTE — Telephone Encounter (Signed)
Pt aware of [facility] RF sent yesterday, will check with pharmacy, Mentioned she will get blood work done when she comes in for her annual.

## 2019-01-23 NOTE — Telephone Encounter (Signed)
Rx RF already sent on 01/22/19. Pls check with pharmacy. Also, lab orders in system, expire 02/24/19. Can she get done before then? If not, I will put in new orders Thx

## 2019-01-23 NOTE — Telephone Encounter (Signed)
Patient is schedule for annual for 02/27/18. Patient is requesting blood work and refill on Birth control. Please advise

## 2019-01-23 NOTE — Telephone Encounter (Signed)
New lab orders placed.

## 2019-01-31 DIAGNOSIS — Z20828 Contact with and (suspected) exposure to other viral communicable diseases: Secondary | ICD-10-CM | POA: Diagnosis not present

## 2019-02-05 IMAGING — MG DIGITAL SCREENING BILATERAL MAMMOGRAM WITH TOMO AND CAD
8 series · 8 of 24 positions shown · non-contrast
Comparison: Previous exam(s).

CLINICAL DATA: Screening.

EXAM:
DIGITAL SCREENING BILATERAL MAMMOGRAM WITH TOMO AND CAD

[L MLO synth-2D]
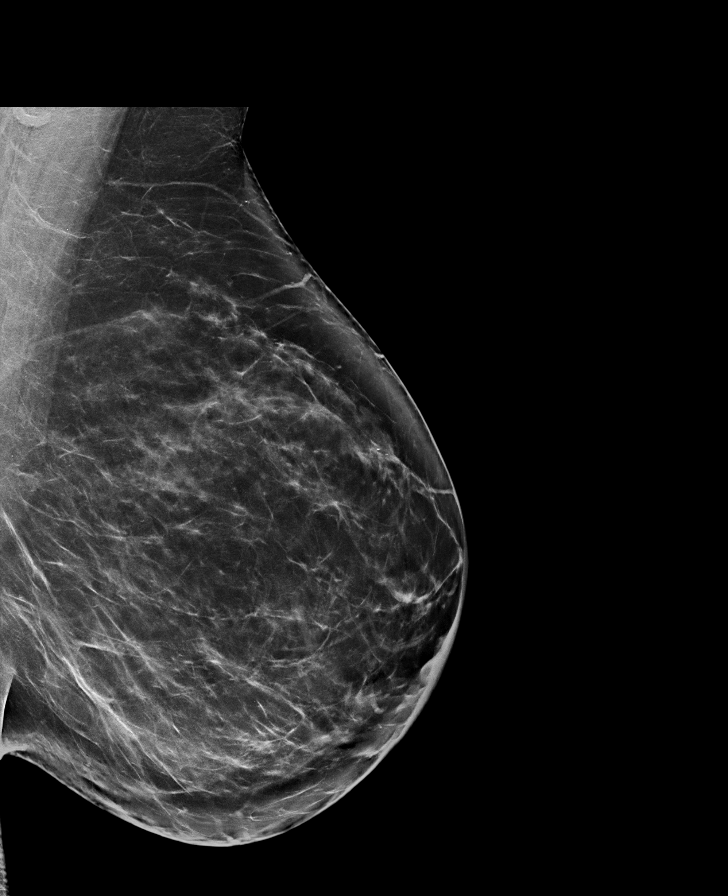

[L CC synth-2D]
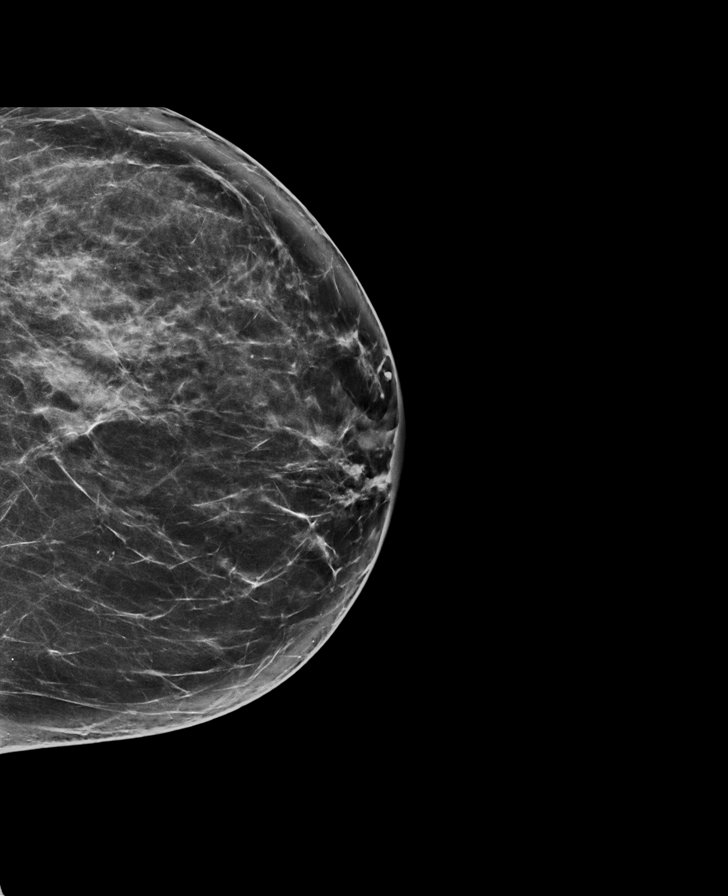

[R CC synth-2D]
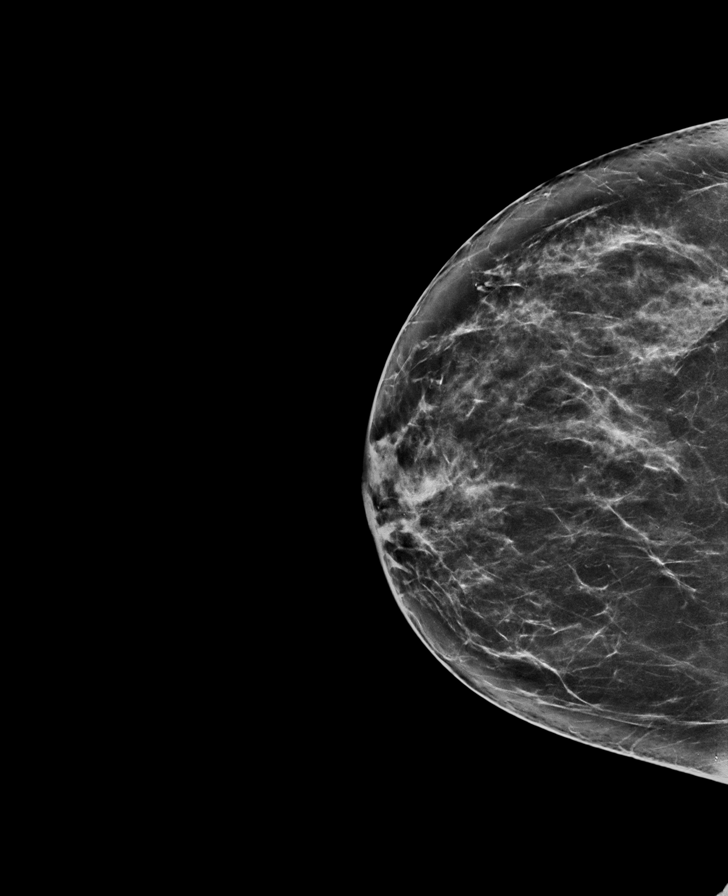

[R MLO synth-2D]
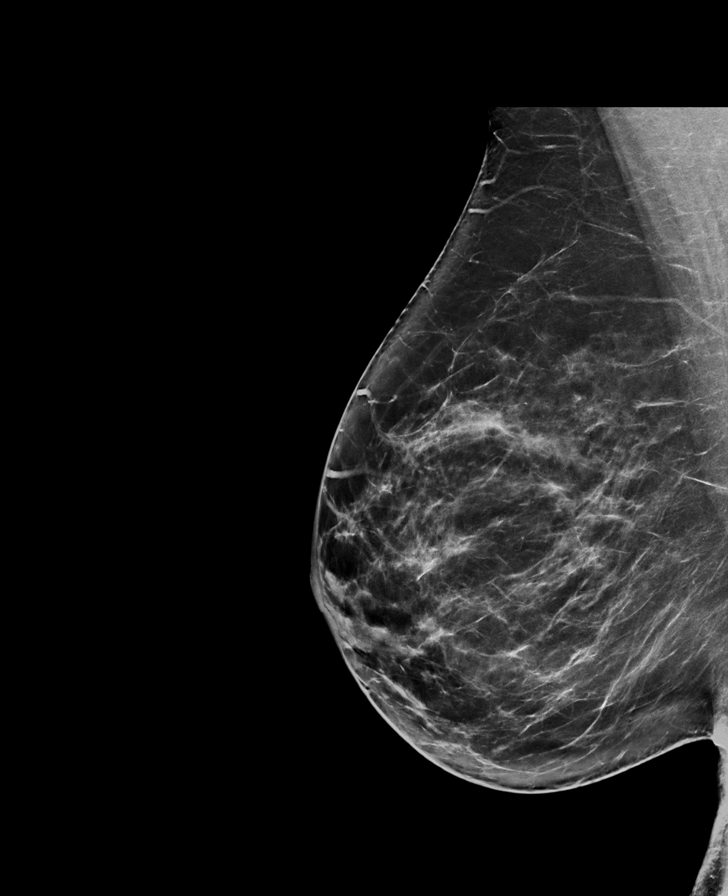

[R CC tomo · tomo slice 40/79.0]
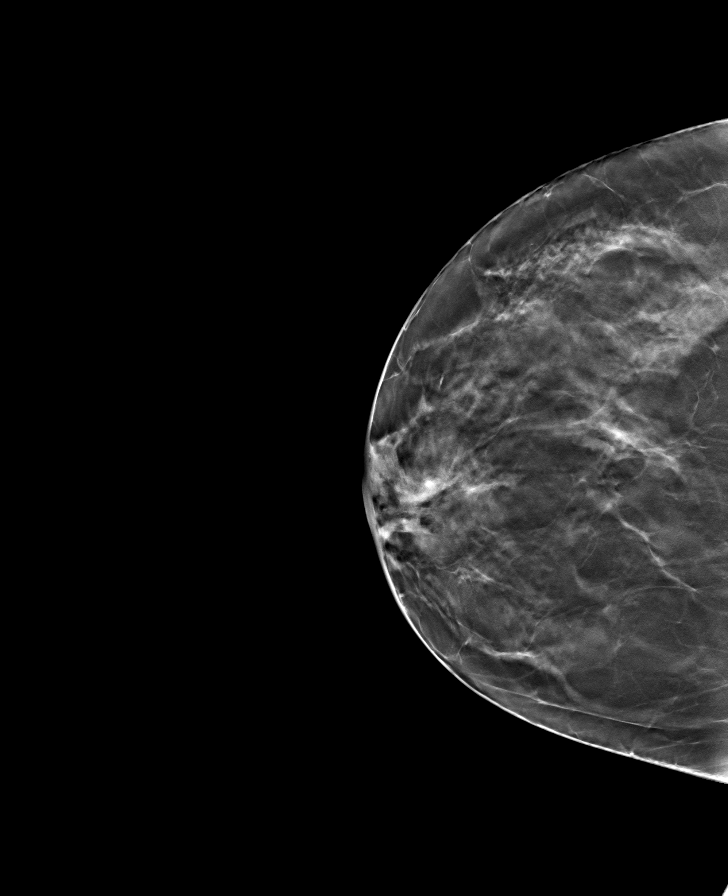

[L MLO tomo · tomo slice 47/94.0]
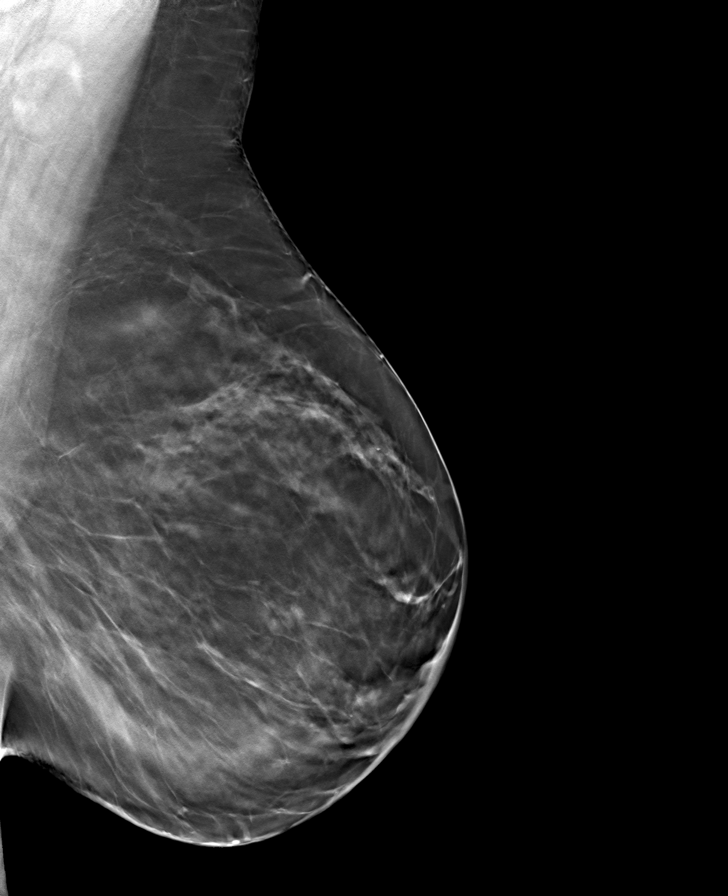

[R MLO tomo · tomo slice 45/89.0]
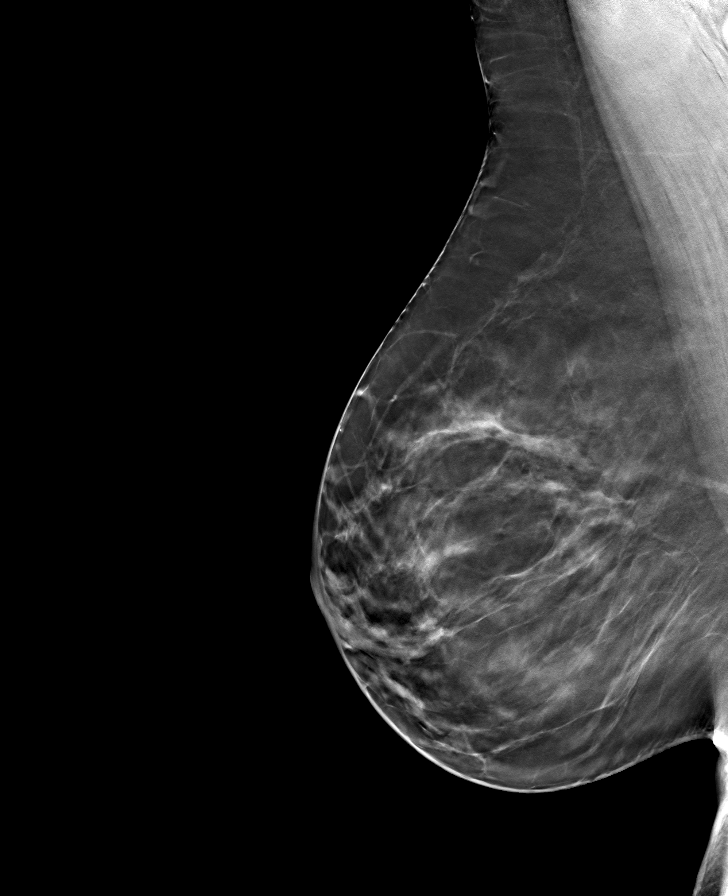

[L CC tomo · tomo slice 43/84.0]
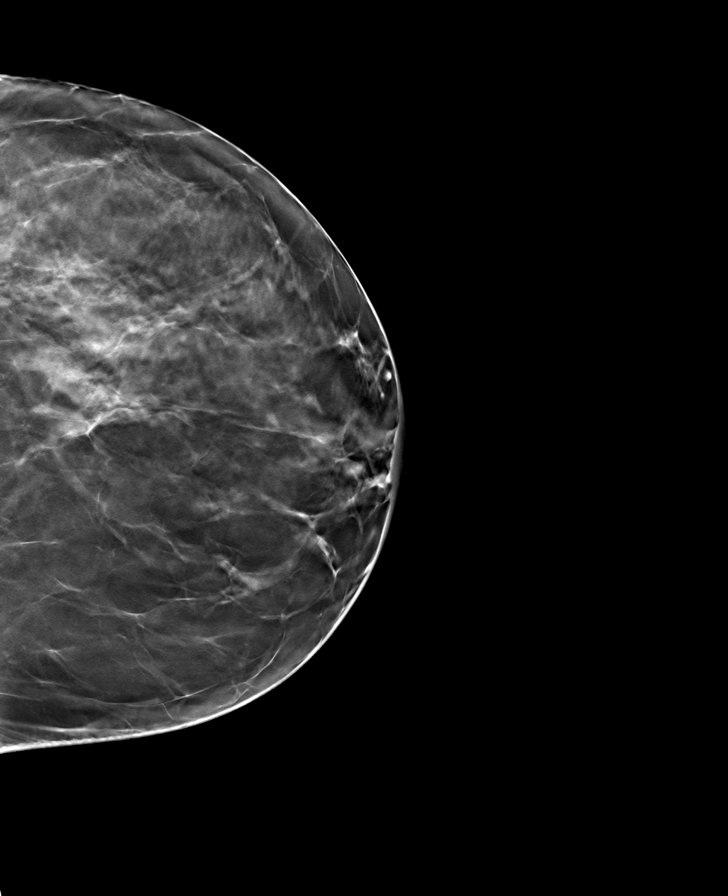

[8 of 24 positions shown; findings below may reference images not displayed]

ACR Breast Density Category c: The breast tissue is heterogeneously
dense, which may obscure small masses.
FINDINGS: There are no findings suspicious for malignancy. Images were
processed with CAD.
IMPRESSION: No mammographic evidence of malignancy. A result letter of this
screening mammogram will be mailed directly to the patient.

RECOMMENDATION:
Screening mammogram in one year. (Code:FT-U-LHB)

BI-RADS CATEGORY  1: Negative.

## 2019-02-08 DIAGNOSIS — M4608 Spinal enthesopathy, sacral and sacrococcygeal region: Secondary | ICD-10-CM | POA: Diagnosis not present

## 2019-02-08 DIAGNOSIS — M9904 Segmental and somatic dysfunction of sacral region: Secondary | ICD-10-CM | POA: Diagnosis not present

## 2019-02-08 DIAGNOSIS — M5442 Lumbago with sciatica, left side: Secondary | ICD-10-CM | POA: Diagnosis not present

## 2019-02-08 DIAGNOSIS — M62830 Muscle spasm of back: Secondary | ICD-10-CM | POA: Diagnosis not present

## 2019-02-15 DIAGNOSIS — M62830 Muscle spasm of back: Secondary | ICD-10-CM | POA: Diagnosis not present

## 2019-02-15 DIAGNOSIS — M9904 Segmental and somatic dysfunction of sacral region: Secondary | ICD-10-CM | POA: Diagnosis not present

## 2019-02-15 DIAGNOSIS — M5442 Lumbago with sciatica, left side: Secondary | ICD-10-CM | POA: Diagnosis not present

## 2019-02-15 DIAGNOSIS — M4608 Spinal enthesopathy, sacral and sacrococcygeal region: Secondary | ICD-10-CM | POA: Diagnosis not present

## 2019-02-16 DIAGNOSIS — M4608 Spinal enthesopathy, sacral and sacrococcygeal region: Secondary | ICD-10-CM | POA: Diagnosis not present

## 2019-02-16 DIAGNOSIS — M5442 Lumbago with sciatica, left side: Secondary | ICD-10-CM | POA: Diagnosis not present

## 2019-02-16 DIAGNOSIS — M9904 Segmental and somatic dysfunction of sacral region: Secondary | ICD-10-CM | POA: Diagnosis not present

## 2019-02-16 DIAGNOSIS — M62830 Muscle spasm of back: Secondary | ICD-10-CM | POA: Diagnosis not present

## 2019-02-28 ENCOUNTER — Ambulatory Visit: Payer: BC Managed Care – PPO | Admitting: Obstetrics and Gynecology

## 2019-04-02 NOTE — Progress Notes (Signed)
PCP:  Ellene Route   Chief Complaint  Patient presents with  . Gynecologic Exam     HPI:      [name] is a 44 y.o. G0P0000 who LMP was Patient's last menstrual period was 03/09/2019 (approximate)., presents today for her annual examination.  Her menses are regular every 28-30 days, lasting 4 days.  Dysmenorrhea none. She does not have intermenstrual bleeding.  Sex activity: single partner, contraception - OCP (estrogen/progesterone).  Last Pap: 02/23/18  Results were: no abnormalities /neg HPV DNA  Hx of STDs: HPV on pap  Mammogram: 03/10/18 Results no abnormalities, repeat in 12 months There is a FH of breast cancer in her pat grt aunt, genetic testing not indicated. There is no FH of ovarian cancer. The patient does do self-breast exams.  Tobacco use: The patient denies current or previous tobacco use. Alcohol use: social drinker No drug use.  Exercise: mod active  She does get adequate calcium and Vitamin D in her diet.  Normal labs 2017. Due for fasting labs this yr.  Past Medical History:  Diagnosis Date  . Cervical dysplasia     Past Surgical History:  Procedure Laterality Date  . COLPOSCOPY    . KNEE SURGERY    . TONSILLECTOMY    . WISDOM TOOTH EXTRACTION      Family History  Problem Relation Age of Onset  . Diabetes Mellitus II Mother   . Diabetes Mellitus II Maternal Grandfather   . Diabetes Mellitus II Paternal Grandmother   . Cancer Other 30       Breast  . Breast cancer Paternal Aunt        pat great aunt    Social History   Socioeconomic History  . Marital status: Married    Spouse name: Not on file  . Number of children: Not on file  . Years of education: Not on file  . Highest education level: Not on file  Occupational History  . Not on file  Tobacco Use  . Smoking status: Never Smoker  . Smokeless tobacco: Never Used  Substance and Sexual Activity  . Alcohol use: Yes    Comment: social  . Drug use: No   . Sexual activity: Yes    Birth control/protection: Pill  Other Topics Concern  . Not on file  Social History Narrative  . Not on file   Social Determinants of Health   Financial Resource Strain:   . Difficulty of Paying Living Expenses: Not on file  Food Insecurity:   . Worried About Charity fundraiser in the Last Year: Not on file  . Ran Out of Food in the Last Year: Not on file  Transportation Needs:   . Lack of Transportation (Medical): Not on file  . Lack of Transportation (Non-Medical): Not on file  Physical Activity:   . Days of Exercise per Week: Not on file  . Minutes of Exercise per Session: Not on file  Stress:   . Feeling of Stress : Not on file  Social Connections:   . Frequency of Communication with Friends and Family: Not on file  . Frequency of Social Gatherings with Friends and Family: Not on file  . Attends Religious Services: Not on file  . Active Member of Clubs or Organizations: Not on file  . Attends Archivist Meetings: Not on file  . Marital Status: Not on file  Intimate Partner Violence:   . Fear of Current or Ex-Partner: Not on  file  . Emotionally Abused: Not on file  . Physically Abused: Not on file  . Sexually Abused: Not on file    Current Meds  Medication Sig  . cetirizine (ZYRTEC) 10 MG tablet Take by mouth.  . Multiple Vitamins-Minerals (MULTIVITAMIN PO) Take by mouth.  Marland Kitchen omeprazole (PRILOSEC) 20 MG capsule Take by mouth.  . [DISCONTINUED] norgestimate-ethinyl estradiol (ESTARYLLA) 0.25-35 MG-MCG tablet Take 1 tablet by mouth daily.  . norgestimate-ethinyl estradiol (ESTARYLLA) 0.25-35 MG-MCG tablet Take 1 tablet by mouth daily.     ROS:  Review of Systems  Constitutional: Negative for fatigue, fever and unexpected weight change.  Respiratory: Negative for cough, shortness of breath and wheezing.   Cardiovascular: Negative for chest pain, palpitations and leg swelling.  Gastrointestinal: Negative for blood in stool,  constipation, diarrhea, nausea and vomiting.  Endocrine: Negative for cold intolerance, heat intolerance and polyuria.  Genitourinary: Negative for dyspareunia, dysuria, flank pain, frequency, genital sores, hematuria, menstrual problem, pelvic pain, urgency, vaginal bleeding, vaginal discharge and vaginal pain.  Musculoskeletal: Negative for back pain, joint swelling and myalgias.  Skin: Negative for rash.  Neurological: Negative for dizziness, syncope, light-headedness, numbness and headaches.  Hematological: Negative for adenopathy.  Psychiatric/Behavioral: Negative for agitation, confusion, sleep disturbance and suicidal ideas. The patient is not nervous/anxious.      Objective: BP 130/90   Ht 5\' 5"  (1.651 m)   Wt 224 lb (101.6 kg)   LMP 03/09/2019 (Approximate)   BMI 37.28 kg/m    Physical Exam Constitutional:      Appearance: She is well-developed.  Genitourinary:     Vulva, vagina, uterus, right adnexa and left adnexa normal.     No vulval lesion or tenderness noted.     No vaginal discharge, erythema or tenderness.     No cervical motion tenderness or polyp.     Uterus is not enlarged or tender.     No right or left adnexal mass present.     Right adnexa not tender.     Left adnexa not tender.  Neck:     Thyroid: No thyromegaly.  Cardiovascular:     Rate and Rhythm: Normal rate and regular rhythm.     Heart sounds: Normal heart sounds. No murmur.  Pulmonary:     Effort: Pulmonary effort is normal.     Breath sounds: Normal breath sounds.  Chest:     Breasts:        Right: No mass, nipple discharge, skin change or tenderness.        Left: No mass, nipple discharge, skin change or tenderness.  Abdominal:     Palpations: Abdomen is soft.     Tenderness: There is no abdominal tenderness. There is no guarding.  Musculoskeletal:        General: Normal range of motion.     Cervical back: Normal range of motion.  Neurological:     General: No focal deficit present.      Mental Status: She is alert and oriented to person, place, and time.     Cranial Nerves: No cranial nerve deficit.  Skin:    General: Skin is warm and dry.  Psychiatric:        Mood and Affect: Mood normal.        Behavior: Behavior normal.        Thought Content: Thought content normal.        Judgment: Judgment normal.  Vitals reviewed.     Assessment/Plan: Encounter for annual routine gynecological examination  Encounter for screening mammogram for malignant neoplasm of breast - Plan: MM 3D SCREEN BREAST BILATERAL; pt to sched mammo  Encounter for surveillance of contraceptive pills - Plan: norgestimate-ethinyl estradiol (ESTARYLLA) 0.25-35 MG-MCG tablet; OCP RF  Blood tests for routine general physical examination - Plan: Lipid panel, Hemoglobin A1c, Comprehensive metabolic panel  Screening cholesterol level - Plan: Lipid panel  Screening for diabetes mellitus - Plan: Hemoglobin A1c  Meds ordered this encounter  Medications  . norgestimate-ethinyl estradiol (ESTARYLLA) 0.25-35 MG-MCG tablet    Sig: Take 1 tablet by mouth daily.    Dispense:  84 tablet    Refill:  3    Order Specific Question:   Supervising Provider    Answer:   Gae Dry [id number]             GYN counsel adequate intake of calcium and vitamin D, diet and exercise     F/U  Return in about 1 year (around 04/02/2020).  Jenna Mccarty B. Anthem Frazer, PA-C 04/03/2019 8:43 AM

## 2019-04-03 ENCOUNTER — Ambulatory Visit (INDEPENDENT_AMBULATORY_CARE_PROVIDER_SITE_OTHER): Payer: BC Managed Care – PPO | Admitting: Obstetrics and Gynecology

## 2019-04-03 ENCOUNTER — Encounter: Payer: Self-pay | Admitting: Obstetrics and Gynecology

## 2019-04-03 ENCOUNTER — Other Ambulatory Visit: Payer: Self-pay

## 2019-04-03 VITALS — BP 130/90 | Ht 65.0 in | Wt 224.0 lb

## 2019-04-03 DIAGNOSIS — Z13220 Encounter for screening for lipoid disorders: Secondary | ICD-10-CM

## 2019-04-03 DIAGNOSIS — Z1231 Encounter for screening mammogram for malignant neoplasm of breast: Secondary | ICD-10-CM

## 2019-04-03 DIAGNOSIS — Z131 Encounter for screening for diabetes mellitus: Secondary | ICD-10-CM | POA: Diagnosis not present

## 2019-04-03 DIAGNOSIS — Z3041 Encounter for surveillance of contraceptive pills: Secondary | ICD-10-CM | POA: Diagnosis not present

## 2019-04-03 DIAGNOSIS — Z01419 Encounter for gynecological examination (general) (routine) without abnormal findings: Secondary | ICD-10-CM

## 2019-04-03 DIAGNOSIS — Z0000 Encounter for general adult medical examination without abnormal findings: Secondary | ICD-10-CM | POA: Diagnosis not present

## 2019-04-03 MED ORDER — NORGESTIMATE-ETH ESTRADIOL 0.25-35 MG-MCG PO TABS: 1.0000 | ORAL_TABLET | Freq: Every day | ORAL | 3 refills | Status: DC

## 2019-04-03 NOTE — Patient Instructions (Addendum)
I value your feedback and entrusting us with your care. If you get a [facility] patient survey, I would appreciate you taking the time to let us know about your experience today. Thank you! ° °As of January 26, 2019, your lab results will be released to your MyChart immediately, before I even have a chance to see them. Please give me time to review them and contact you if there are any abnormalities. Thank you for your patience.  ° °[facility]: [phone] ° ° ° °

## 2019-04-04 LAB — COMPREHENSIVE METABOLIC PANEL
ALT: 15 IU/L (ref 0–32)
AST: 20 IU/L (ref 0–40)
Albumin/Globulin Ratio: 1.5 (ref 1.2–2.2)
Albumin: 4 g/dL (ref 3.8–4.8)
Alkaline Phosphatase: 81 IU/L (ref 39–117)
BUN/Creatinine Ratio: 15 (ref 9–23)
BUN: 14 mg/dL (ref 6–24)
Bilirubin Total: 0.2 mg/dL (ref 0.0–1.2)
CO2: 23 mmol/L (ref 20–29)
Calcium: 9.4 mg/dL (ref 8.7–10.2)
Chloride: 104 mmol/L (ref 96–106)
Creatinine, Ser: 0.96 mg/dL (ref 0.57–1.00)
GFR calc Af Amer: 85 mL/min/{1.73_m2} (ref >59)
GFR calc non Af Amer: 74 mL/min/{1.73_m2} (ref >59)
Globulin, Total: 2.7 g/dL (ref 1.5–4.5)
Glucose: 94 mg/dL (ref 65–99)
Potassium: 4.3 mmol/L (ref 3.5–5.2)
Sodium: 141 mmol/L (ref 134–144)
Total Protein: 6.7 g/dL (ref 6.0–8.5)

## 2019-04-04 LAB — LIPID PANEL
Chol/HDL Ratio: 3.4 ratio (ref 0.0–4.4)
Cholesterol, Total: 207 mg/dL — ABNORMAL HIGH (ref 100–199)
HDL: 61 mg/dL (ref >39)
LDL Chol Calc (NIH): 113 mg/dL — ABNORMAL HIGH (ref 0–99)
Triglycerides: 189 mg/dL — ABNORMAL HIGH (ref 0–149)
VLDL Cholesterol Cal: 33 mg/dL (ref 5–40)

## 2019-04-04 LAB — HEMOGLOBIN A1C
Est. average glucose Bld gHb Est-mCnc: 120 mg/dL
Hgb A1c MFr Bld: 5.8 % — ABNORMAL HIGH (ref 4.8–5.6)

## 2019-04-14 ENCOUNTER — Other Ambulatory Visit: Payer: Self-pay | Admitting: Obstetrics and Gynecology

## 2019-04-14 DIAGNOSIS — Z3041 Encounter for surveillance of contraceptive pills: Secondary | ICD-10-CM

## 2019-04-18 ENCOUNTER — Encounter: Payer: Self-pay | Admitting: Obstetrics and Gynecology

## 2019-04-18 ENCOUNTER — Other Ambulatory Visit: Payer: Self-pay

## 2019-04-18 ENCOUNTER — Ambulatory Visit
Admission: RE | Admit: 2019-04-18 | Discharge: 2019-04-18 | Disposition: A | Discharge status: Home / Self Care | Payer: BC Managed Care – PPO | Source: Ambulatory Visit | Attending: Obstetrics and Gynecology | Admitting: Obstetrics and Gynecology

## 2019-04-18 DIAGNOSIS — Z1231 Encounter for screening mammogram for malignant neoplasm of breast: Secondary | ICD-10-CM | POA: Diagnosis not present

## 2019-07-27 DIAGNOSIS — Z20822 Contact with and (suspected) exposure to covid-19: Secondary | ICD-10-CM

## 2019-07-27 HISTORY — DX: Contact with and (suspected) exposure to covid-19: Z20.822

## 2020-01-08 ENCOUNTER — Encounter: Payer: Self-pay | Admitting: Dermatology

## 2020-01-08 ENCOUNTER — Ambulatory Visit (INDEPENDENT_AMBULATORY_CARE_PROVIDER_SITE_OTHER): Payer: BC Managed Care – PPO | Admitting: Dermatology

## 2020-01-08 ENCOUNTER — Other Ambulatory Visit: Payer: Self-pay

## 2020-01-08 DIAGNOSIS — Z86018 Personal history of other benign neoplasm: Secondary | ICD-10-CM | POA: Diagnosis not present

## 2020-01-08 DIAGNOSIS — Z1283 Encounter for screening for malignant neoplasm of skin: Secondary | ICD-10-CM

## 2020-01-08 DIAGNOSIS — L814 Other melanin hyperpigmentation: Secondary | ICD-10-CM | POA: Diagnosis not present

## 2020-01-08 DIAGNOSIS — D2261 Melanocytic nevi of right upper limb, including shoulder: Secondary | ICD-10-CM

## 2020-01-08 DIAGNOSIS — D1800 Hemangioma unspecified site: Secondary | ICD-10-CM

## 2020-01-08 DIAGNOSIS — D229 Melanocytic nevi, unspecified: Secondary | ICD-10-CM | POA: Diagnosis not present

## 2020-01-08 DIAGNOSIS — L578 Other skin changes due to chronic exposure to nonionizing radiation: Secondary | ICD-10-CM

## 2020-01-08 DIAGNOSIS — D225 Melanocytic nevi of trunk: Secondary | ICD-10-CM

## 2020-01-08 NOTE — Progress Notes (Signed)
   Follow-Up Visit   Subjective  Jenna Mccarty is a 44 y.o. female who presents for the following: Annual Exam (Total body skin exam, hx of Dyspalstic nevi).  No new or changing moles.   The following portions of the chart were reviewed this encounter and updated as appropriate:      Review of Systems:  No other skin or systemic complaints except as noted in HPI or Assessment and Plan.  Objective  Well appearing patient in no apparent distress; mood and affect are within normal limits.  A full examination was performed including scalp, head, eyes, ears, nose, lips, neck, chest, axillae, abdomen, back, buttocks, bilateral upper extremities, bilateral lower extremities, hands, feet, fingers, toes, fingernails, and toenails. All findings within normal limits unless otherwise noted below.  Objective  Left Lower pretibia, L vulva: Scar with no evidence of recurrence.   Right Elbow; Right chest; Left Abdomen  Objective  Right chest: 4.59mm brown flat pap  Right Elbow: 4.23mm brown pap  Left Abdomen: 5.0 x 4.66mm med dark brown macule   Assessment & Plan   Skin cancer screening performed today.  Melanocytic Nevi - Tan-brown and/or pink-flesh-colored symmetric macules and papules - Benign appearing on exam today - Observation - Call clinic for new or changing moles, ABCDEs of mole observation reviewed - Recommend daily use of broad spectrum spf 30+ sunscreen to sun-exposed areas.   Lentigines - Scattered tan macules - Discussed due to sun exposure - Benign, observe - Call for any changes  Actinic Damage - chronic, secondary to cumulative UV radiation exposure/sun exposure over time - diffuse scaly erythematous macules with underlying dyspigmentation - Recommend daily broad spectrum sunscreen SPF 30+ to sun-exposed areas, reapply every 2 hours as needed.  - Call for new or changing lesions.  History of dysplastic nevus Left Lower pretibia, L vulva  Clear.  Observe for recurrence. Call clinic for new or changing lesions.  Recommend regular skin exams, daily broad-spectrum spf 30+ sunscreen use, and photoprotection.     Nevus (3) Right Elbow; Right chest; Left Abdomen  Benign-appearing.  Stable. Observation.  Call clinic for new or changing moles.  Recommend daily use of broad spectrum spf 30+ sunscreen to sun-exposed areas.     Hemangiomas - Red papules - Discussed benign nature - Observe - Call for any changes   Return in about 1 year (around 01/07/2021) for TBSE, hx of dysplastic nevi.   I, Othelia Pulling, RMA, am acting as scribe for Brendolyn Patty, MD . Documentation: I have reviewed the above documentation for accuracy and completeness, and I agree with the above.  Brendolyn Patty MD

## 2020-01-08 NOTE — Patient Instructions (Signed)

## 2020-03-31 ENCOUNTER — Other Ambulatory Visit: Payer: Self-pay | Admitting: Obstetrics and Gynecology

## 2020-03-31 DIAGNOSIS — Z3041 Encounter for surveillance of contraceptive pills: Secondary | ICD-10-CM

## 2020-04-22 ENCOUNTER — Ambulatory Visit: Payer: BC Managed Care – PPO | Admitting: Obstetrics and Gynecology

## 2020-04-25 NOTE — Progress Notes (Signed)
PCP:  Ellene Route   Chief Complaint  Patient presents with   Gynecologic Exam    No concerns     HPI:      [name] is a 45 y.o. G0P0000 who LMP was Patient's last menstrual period was 04/04/2020 (exact date)., presents today for her annual examination.  Her menses are regular every 28-30 days, lasting 4 days.  Dysmenorrhea none. She does not have intermenstrual bleeding.  Sex activity: single partner, contraception - OCP (estrogen/progesterone).  Last Pap: 02/23/18  Results were: no abnormalities /neg HPV DNA  Hx of STDs: HPV on pap  Mammogram: 04/18/19 Results no abnormalities, repeat in 12 months There is a FH of breast cancer in her pat grt aunt, genetic testing not indicated. There is no FH of ovarian cancer. The patient does do self-breast exams.  Tobacco use: The patient denies current or previous tobacco use. Alcohol use: social drinker No drug use.  Exercise: mod active  She does get adequate calcium and Vitamin D in her diet.  Borderline lipids last yr, due for repeat labs this yr.  Past Medical History:  Diagnosis Date   Cervical dysplasia    Hx of dysplastic nevus ~2018   L lower pretibia, txted by Dr. Phillip Heal   Hx of dysplastic nevus ~2018   L vulva, txted by Dr. Phillip Heal    Past Surgical History:  Procedure Laterality Date   COLPOSCOPY     KNEE SURGERY     TONSILLECTOMY     WISDOM TOOTH EXTRACTION      Family History  Problem Relation Age of Onset   Diabetes Mellitus II Mother    Diabetes Mellitus II Maternal Grandfather    Diabetes Mellitus II Paternal Grandmother    Cancer Other 87       Breast   Breast cancer Paternal Aunt        pat great aunt    Social History   Socioeconomic History   Marital status: Married    Spouse name: Not on file   Number of children: Not on file   Years of education: Not on file   Highest education level: Not on file  Occupational History   Not on file  Tobacco  Use   Smoking status: Never Smoker   Smokeless tobacco: Never Used  Vaping Use   Vaping Use: Never used  Substance and Sexual Activity   Alcohol use: Yes    Comment: social   Drug use: No   Sexual activity: Yes    Birth control/protection: Pill  Other Topics Concern   Not on file  Social History Narrative   Not on file   Social Determinants of Health   Financial Resource Strain: Not on file  Food Insecurity: Not on file  Transportation Needs: Not on file  Physical Activity: Not on file  Stress: Not on file  Social Connections: Not on file  Intimate Partner Violence: Not on file    Current Meds  Medication Sig   cetirizine (ZYRTEC) 10 MG tablet Take by mouth.   Multiple Vitamins-Minerals (MULTIVITAMIN PO) Take by mouth.   omeprazole (PRILOSEC) 20 MG capsule Take by mouth.   [DISCONTINUED] ESTARYLLA 0.25-35 MG-MCG tablet TAKE 1 TABLET BY MOUTH DAILY     ROS:  Review of Systems  Constitutional: Negative for fatigue, fever and unexpected weight change.  Respiratory: Negative for cough, shortness of breath and wheezing.   Cardiovascular: Negative for chest pain, palpitations and leg swelling.  Gastrointestinal: Negative for  blood in stool, constipation, diarrhea, nausea and vomiting.  Endocrine: Negative for cold intolerance, heat intolerance and polyuria.  Genitourinary: Negative for dyspareunia, dysuria, flank pain, frequency, genital sores, hematuria, menstrual problem, pelvic pain, urgency, vaginal bleeding, vaginal discharge and vaginal pain.  Musculoskeletal: Negative for back pain, joint swelling and myalgias.  Skin: Negative for rash.  Neurological: Negative for dizziness, syncope, light-headedness, numbness and headaches.  Hematological: Negative for adenopathy.  Psychiatric/Behavioral: Negative for agitation, confusion, sleep disturbance and suicidal ideas. The patient is not nervous/anxious.      Objective: BP 120/80    Ht 5\' 5"  (1.651 m)    Wt  220 lb (99.8 kg)    LMP 04/04/2020 (Exact Date)    BMI 36.61 kg/m    Physical Exam Constitutional:      Appearance: She is well-developed.  Genitourinary:     Vulva normal.     Right Labia: No rash, tenderness or lesions.    Left Labia: No tenderness, lesions or rash.    No vaginal discharge, erythema or tenderness.      Right Adnexa: not tender and no mass present.    Left Adnexa: not tender and no mass present.    No cervical motion tenderness, friability or polyp.     Uterus is not enlarged or tender.  Breasts:     Right: No mass, nipple discharge, skin change or tenderness.     Left: No mass, nipple discharge, skin change or tenderness.    Neck:     Thyroid: No thyromegaly.  Cardiovascular:     Rate and Rhythm: Normal rate and regular rhythm.     Heart sounds: Normal heart sounds. No murmur heard.   Pulmonary:     Effort: Pulmonary effort is normal.     Breath sounds: Normal breath sounds.  Abdominal:     Palpations: Abdomen is soft.     Tenderness: There is no abdominal tenderness. There is no guarding or rebound.  Musculoskeletal:        General: Normal range of motion.     Cervical back: Normal range of motion.  Lymphadenopathy:     Cervical: No cervical adenopathy.  Neurological:     General: No focal deficit present.     Mental Status: She is alert and oriented to person, place, and time.     Cranial Nerves: No cranial nerve deficit.  Skin:    General: Skin is warm and dry.  Psychiatric:        Mood and Affect: Mood normal.        Behavior: Behavior normal.        Thought Content: Thought content normal.        Judgment: Judgment normal.  Vitals reviewed.     Assessment/Plan: Encounter for annual routine gynecological examination  Encounter for surveillance of contraceptive pills - Plan: norgestimate-ethinyl estradiol (ESTARYLLA) 0.25-35 MG-MCG tablet; OCP RF.  Encounter for screening mammogram for malignant neoplasm of breast - Plan: MM 3D SCREEN  BREAST BILATERAL; pt to sched mammo  Elevated lipids - Plan: Lipid panel  Screening cholesterol level - Plan: Lipid panel   Meds ordered this encounter  Medications   norgestimate-ethinyl estradiol (ESTARYLLA) 0.25-35 MG-MCG tablet    Sig: Take 1 tablet by mouth daily.    Dispense:  84 tablet    Refill:  3    Order Specific Question:   Supervising Provider    Answer:   Gae Dry [[id number]]  GYN counsel adequate intake of calcium and vitamin D, diet and exercise     F/U  Return in about 1 year (around 04/26/2021).  Shelonda Saxe B. Annison Birchard, PA-C 04/26/2020 8:48 AM

## 2020-04-26 ENCOUNTER — Encounter: Payer: Self-pay | Admitting: Obstetrics and Gynecology

## 2020-04-26 ENCOUNTER — Other Ambulatory Visit: Payer: Self-pay

## 2020-04-26 ENCOUNTER — Ambulatory Visit (INDEPENDENT_AMBULATORY_CARE_PROVIDER_SITE_OTHER): Payer: BC Managed Care – PPO | Admitting: Obstetrics and Gynecology

## 2020-04-26 VITALS — BP 120/80 | Ht 65.0 in | Wt 220.0 lb

## 2020-04-26 DIAGNOSIS — E785 Hyperlipidemia, unspecified: Secondary | ICD-10-CM | POA: Diagnosis not present

## 2020-04-26 DIAGNOSIS — Z13220 Encounter for screening for lipoid disorders: Secondary | ICD-10-CM | POA: Diagnosis not present

## 2020-04-26 DIAGNOSIS — Z3041 Encounter for surveillance of contraceptive pills: Secondary | ICD-10-CM

## 2020-04-26 DIAGNOSIS — Z1231 Encounter for screening mammogram for malignant neoplasm of breast: Secondary | ICD-10-CM | POA: Diagnosis not present

## 2020-04-26 DIAGNOSIS — Z01419 Encounter for gynecological examination (general) (routine) without abnormal findings: Secondary | ICD-10-CM

## 2020-04-26 MED ORDER — NORGESTIMATE-ETH ESTRADIOL 0.25-35 MG-MCG PO TABS: 1.0000 | ORAL_TABLET | Freq: Every day | ORAL | 3 refills | Status: DC

## 2020-04-26 NOTE — Patient Instructions (Signed)
I value your feedback and you entrusting us with your care. If you get a [facility] patient survey, I would appreciate you taking the time to let us know about your experience today. Thank you!  [facility]: [phone]      

## 2020-04-27 LAB — LIPID PANEL
Chol/HDL Ratio: 4.3 ratio (ref 0.0–4.4)
Cholesterol, Total: 204 mg/dL — ABNORMAL HIGH (ref 100–199)
HDL: 48 mg/dL (ref >39)
LDL Chol Calc (NIH): 130 mg/dL — ABNORMAL HIGH (ref 0–99)
Triglycerides: 147 mg/dL (ref 0–149)
VLDL Cholesterol Cal: 26 mg/dL (ref 5–40)

## 2020-05-21 ENCOUNTER — Other Ambulatory Visit: Payer: Self-pay

## 2020-05-21 ENCOUNTER — Ambulatory Visit
Admission: RE | Admit: 2020-05-21 | Discharge: 2020-05-21 | Disposition: A | Discharge status: Home / Self Care | Payer: BC Managed Care – PPO | Source: Ambulatory Visit | Attending: Obstetrics and Gynecology | Admitting: Obstetrics and Gynecology

## 2020-05-21 DIAGNOSIS — Z1231 Encounter for screening mammogram for malignant neoplasm of breast: Secondary | ICD-10-CM | POA: Diagnosis not present

## 2021-01-20 ENCOUNTER — Encounter: Payer: BC Managed Care – PPO | Admitting: Dermatology

## 2021-03-20 ENCOUNTER — Other Ambulatory Visit: Payer: Self-pay | Admitting: Obstetrics and Gynecology

## 2021-03-20 DIAGNOSIS — Z3041 Encounter for surveillance of contraceptive pills: Secondary | ICD-10-CM

## 2021-03-22 ENCOUNTER — Other Ambulatory Visit: Payer: Self-pay | Admitting: Obstetrics and Gynecology

## 2021-03-22 DIAGNOSIS — Z3041 Encounter for surveillance of contraceptive pills: Secondary | ICD-10-CM

## 2021-03-22 MED ORDER — NORGESTIMATE-ETH ESTRADIOL 0.25-35 MG-MCG PO TABS: 1.0000 | ORAL_TABLET | Freq: Every day | ORAL | 0 refills | Status: DC

## 2021-04-24 NOTE — Progress Notes (Signed)
? ?PCP:  Ellene Route ? ? ?Chief Complaint  ?Patient presents with  ? Gynecologic Exam  ?  No concerns  ? ? ? ?HPI: ?     [name] is a 46 y.o. G0P0000 who LMP was Patient's last menstrual period was 04/03/2021 (exact date)., presents today for her annual examination.  Her menses are regular every 28-30 days, lasting 4 days on OCPs.  Dysmenorrhea none. She does not have intermenstrual bleeding. ? ?Sex activity: single partner, contraception - OCP (estrogen/progesterone).  ?Last Pap: 02/23/18  Results were: no abnormalities /neg HPV DNA  ?Hx of STDs: HPV on pap ? ?Mammogram: 05/21/20 Results no abnormalities, repeat in 12 months ?There is a FH of breast cancer in her pat grt aunt, genetic testing not indicated. There is no FH of ovarian cancer. The patient does do self-breast exams. ? ?Tobacco use: The patient denies current or previous tobacco use. ?Alcohol use: social drinker ?No drug use.  ?Exercise: mod active ? ?She does get adequate calcium and Vitamin D in her diet. ? ?Borderline lipids last yr, due for repeat labs this yr. ? ?Pt with worsening SUI. Sx with sneeze and cough, not jumping/exercise. Hasn't tried kegels. Drinks 1 caffeinated drink daily. ? ?Past Medical History:  ?Diagnosis Date  ? Cervical dysplasia   ? Hx of dysplastic nevus ~2018  ? L lower pretibia, txted by Dr. Phillip Heal  ? Hx of dysplastic nevus ~2018  ? L vulva, txted by Dr. Phillip Heal  ? ? ?Past Surgical History:  ?Procedure Laterality Date  ? COLPOSCOPY    ? KNEE SURGERY    ? TONSILLECTOMY    ? WISDOM TOOTH EXTRACTION    ? ? ?Family History  ?Problem Relation Age of Onset  ? Diabetes Mellitus II Mother   ? Diabetes Mellitus II Maternal Grandfather   ? Diabetes Mellitus II Paternal Grandmother   ? Cancer Other 50  ?     Breast  ? Breast cancer Paternal Aunt   ?     pat great aunt  ? ? ?Social History  ? ?Socioeconomic History  ? Marital status: Married  ?  Spouse name: Not on file  ? Number of children: Not on file  ?  Years of education: Not on file  ? Highest education level: Not on file  ?Occupational History  ? Not on file  ?Tobacco Use  ? Smoking status: Never  ? Smokeless tobacco: Never  ?Vaping Use  ? Vaping Use: Never used  ?Substance and Sexual Activity  ? Alcohol use: Yes  ?  Comment: social  ? Drug use: No  ? Sexual activity: Yes  ?  Birth control/protection: Pill  ?Other Topics Concern  ? Not on file  ?Social History Narrative  ? Not on file  ? ?Social Determinants of Health  ? ?Financial Resource Strain: Not on file  ?Food Insecurity: Not on file  ?Transportation Needs: Not on file  ?Physical Activity: Not on file  ?Stress: Not on file  ?Social Connections: Not on file  ?Intimate Partner Violence: Not on file  ? ? ?Current Meds  ?Medication Sig  ? cetirizine (ZYRTEC) 10 MG tablet Take by mouth.  ? Multiple Vitamins-Minerals (MULTIVITAMIN PO) Take by mouth.  ? omeprazole (PRILOSEC) 20 MG capsule Take by mouth.  ? [DISCONTINUED] norgestimate-ethinyl estradiol (ESTARYLLA) 0.25-35 MG-MCG tablet Take 1 tablet by mouth daily.  ? ? ? ?ROS: ? ?Review of Systems  ?Constitutional:  Negative for fatigue, fever and unexpected weight change.  ?Respiratory:  Negative for cough, shortness of breath and wheezing.   ?Cardiovascular:  Negative for chest pain, palpitations and leg swelling.  ?Gastrointestinal:  Negative for blood in stool, constipation, diarrhea, nausea and vomiting.  ?Endocrine: Negative for cold intolerance, heat intolerance and polyuria.  ?Genitourinary:  Negative for dyspareunia, dysuria, flank pain, frequency, genital sores, hematuria, menstrual problem, pelvic pain, urgency, vaginal bleeding, vaginal discharge and vaginal pain.  ?Musculoskeletal:  Negative for back pain, joint swelling and myalgias.  ?Skin:  Negative for rash.  ?Neurological:  Negative for dizziness, syncope, light-headedness, numbness and headaches.  ?Hematological:  Negative for adenopathy.  ?Psychiatric/Behavioral:  Negative for agitation,  confusion, sleep disturbance and suicidal ideas. The patient is not nervous/anxious.   ? ? ?Objective: ?BP 126/80   Ht '5\' 5"'$  (1.651 m)   Wt 204 lb (92.5 kg)   LMP 04/03/2021 (Exact Date)   BMI 33.95 kg/m?  ? ? ?Physical Exam ?Constitutional:   ?   Appearance: She is well-developed.  ?Genitourinary:  ?   Vulva normal.  ?   Right Labia: No rash, tenderness or lesions. ?   Left Labia: No tenderness, lesions or rash. ?   No vaginal discharge, erythema or tenderness.  ? ?   Right Adnexa: not tender and no mass present. ?   Left Adnexa: not tender and no mass present. ?   No cervical motion tenderness, friability or polyp.  ?   Uterus is not enlarged or tender.  ?Breasts: ?   Right: No mass, nipple discharge, skin change or tenderness.  ?   Left: No mass, nipple discharge, skin change or tenderness.  ?Neck:  ?   Thyroid: No thyromegaly.  ?Cardiovascular:  ?   Rate and Rhythm: Normal rate and regular rhythm.  ?   Heart sounds: Normal heart sounds. No murmur heard. ?Pulmonary:  ?   Effort: Pulmonary effort is normal.  ?   Breath sounds: Normal breath sounds.  ?Abdominal:  ?   Palpations: Abdomen is soft.  ?   Tenderness: There is no abdominal tenderness. There is no guarding or rebound.  ?Musculoskeletal:     ?   General: Normal range of motion.  ?   Cervical back: Normal range of motion.  ?Lymphadenopathy:  ?   Cervical: No cervical adenopathy.  ?Neurological:  ?   General: No focal deficit present.  ?   Mental Status: She is alert and oriented to person, place, and time.  ?   Cranial Nerves: No cranial nerve deficit.  ?Skin: ?   General: Skin is warm and dry.  ?Psychiatric:     ?   Mood and Affect: Mood normal.     ?   Behavior: Behavior normal.     ?   Thought Content: Thought content normal.     ?   Judgment: Judgment normal.  ?Vitals reviewed.  ? ? ?Assessment/Plan: ?Encounter for annual routine gynecological examination ? ?Encounter for screening mammogram for malignant neoplasm of breast - Plan: MM 3D SCREEN  BREAST BILATERAL; pt schedule mammo ? ?Encounter for surveillance of contraceptive pills - Plan: norgestimate-ethinyl estradiol (ESTARYLLA) 0.25-35 MG-MCG tablet; OCP RF ? ?Elevated lipids - Plan: Lipid panel; check lipids ? ?Blood tests for routine general physical examination - Plan: Comprehensive metabolic panel ? ?SUI (stress urinary incontinence, female) - Plan: Ambulatory referral to Physical Therapy; start kegels, refer to pelvic PT ? ? ?Meds ordered this encounter  ?Medications  ? norgestimate-ethinyl estradiol (ESTARYLLA) 0.25-35 MG-MCG tablet  ?  Sig: Take 1 tablet by  mouth daily.  ?  Dispense:  84 tablet  ?  Refill:  3  ?  Order Specific Question:   Supervising Provider  ?  AnswerGae Dry [[id number]]  ? ?          ?GYN counsel adequate intake of calcium and vitamin D, diet and exercise ? ? ?  F/U ? Return in about 1 year (around 04/29/2022). ? ?Lynnann Knudsen B. Emojean Gertz, PA-C ?04/28/2021 ?9:13 AM ?

## 2021-04-28 ENCOUNTER — Ambulatory Visit (INDEPENDENT_AMBULATORY_CARE_PROVIDER_SITE_OTHER): Payer: BC Managed Care – PPO | Admitting: Obstetrics and Gynecology

## 2021-04-28 ENCOUNTER — Other Ambulatory Visit: Payer: Self-pay

## 2021-04-28 ENCOUNTER — Encounter: Payer: Self-pay | Admitting: Obstetrics and Gynecology

## 2021-04-28 VITALS — BP 126/80 | Ht 65.0 in | Wt 204.0 lb

## 2021-04-28 DIAGNOSIS — E785 Hyperlipidemia, unspecified: Secondary | ICD-10-CM

## 2021-04-28 DIAGNOSIS — Z0000 Encounter for general adult medical examination without abnormal findings: Secondary | ICD-10-CM | POA: Diagnosis not present

## 2021-04-28 DIAGNOSIS — Z1231 Encounter for screening mammogram for malignant neoplasm of breast: Secondary | ICD-10-CM | POA: Diagnosis not present

## 2021-04-28 DIAGNOSIS — Z01419 Encounter for gynecological examination (general) (routine) without abnormal findings: Secondary | ICD-10-CM | POA: Diagnosis not present

## 2021-04-28 DIAGNOSIS — Z3041 Encounter for surveillance of contraceptive pills: Secondary | ICD-10-CM

## 2021-04-28 DIAGNOSIS — N393 Stress incontinence (female) (male): Secondary | ICD-10-CM

## 2021-04-28 MED ORDER — NORGESTIMATE-ETH ESTRADIOL 0.25-35 MG-MCG PO TABS: 1.0000 | ORAL_TABLET | Freq: Every day | ORAL | 3 refills | Status: DC

## 2021-04-28 NOTE — Patient Instructions (Addendum)
I value your feedback and you entrusting us with your care. If you get a [facility] patient survey, I would appreciate you taking the time to let us know about your experience today. Thank you!  [facility]: [phone]      

## 2021-04-29 LAB — COMPREHENSIVE METABOLIC PANEL
ALT: 14 IU/L (ref 0–32)
AST: 23 IU/L (ref 0–40)
Albumin/Globulin Ratio: 1.8 (ref 1.2–2.2)
Albumin: 4.2 g/dL (ref 3.8–4.8)
Alkaline Phosphatase: 72 IU/L (ref 44–121)
BUN/Creatinine Ratio: 18 (ref 9–23)
BUN: 14 mg/dL (ref 6–24)
Bilirubin Total: 0.3 mg/dL (ref 0.0–1.2)
CO2: 20 mmol/L (ref 20–29)
Calcium: 8.8 mg/dL (ref 8.7–10.2)
Chloride: 102 mmol/L (ref 96–106)
Creatinine, Ser: 0.8 mg/dL (ref 0.57–1.00)
Globulin, Total: 2.4 g/dL (ref 1.5–4.5)
Glucose: 92 mg/dL (ref 70–99)
Potassium: 4 mmol/L (ref 3.5–5.2)
Sodium: 137 mmol/L (ref 134–144)
Total Protein: 6.6 g/dL (ref 6.0–8.5)
eGFR: 94 mL/min/{1.73_m2} (ref >59)

## 2021-04-29 LAB — LIPID PANEL
Chol/HDL Ratio: 3.3 ratio (ref 0.0–4.4)
Cholesterol, Total: 204 mg/dL — ABNORMAL HIGH (ref 100–199)
HDL: 61 mg/dL (ref >39)
LDL Chol Calc (NIH): 116 mg/dL — ABNORMAL HIGH (ref 0–99)
Triglycerides: 157 mg/dL — ABNORMAL HIGH (ref 0–149)
VLDL Cholesterol Cal: 27 mg/dL (ref 5–40)

## 2021-06-13 ENCOUNTER — Other Ambulatory Visit: Payer: Self-pay | Admitting: Obstetrics and Gynecology

## 2021-06-13 DIAGNOSIS — Z3041 Encounter for surveillance of contraceptive pills: Secondary | ICD-10-CM

## 2021-06-17 ENCOUNTER — Encounter: Payer: BC Managed Care – PPO | Admitting: Dermatology

## 2021-06-23 ENCOUNTER — Ambulatory Visit
Admission: RE | Admit: 2021-06-23 | Discharge: 2021-06-23 | Disposition: A | Discharge status: Home / Self Care | Payer: BC Managed Care – PPO | Source: Ambulatory Visit | Attending: Obstetrics and Gynecology | Admitting: Obstetrics and Gynecology

## 2021-06-23 DIAGNOSIS — Z1231 Encounter for screening mammogram for malignant neoplasm of breast: Secondary | ICD-10-CM | POA: Diagnosis not present

## 2021-07-07 ENCOUNTER — Ambulatory Visit: Payer: BC Managed Care – PPO | Admitting: Dermatology

## 2021-07-07 DIAGNOSIS — W57XXXA Bitten or stung by nonvenomous insect and other nonvenomous arthropods, initial encounter: Secondary | ICD-10-CM

## 2021-07-07 DIAGNOSIS — L578 Other skin changes due to chronic exposure to nonionizing radiation: Secondary | ICD-10-CM

## 2021-07-07 DIAGNOSIS — D225 Melanocytic nevi of trunk: Secondary | ICD-10-CM | POA: Diagnosis not present

## 2021-07-07 DIAGNOSIS — D1800 Hemangioma unspecified site: Secondary | ICD-10-CM

## 2021-07-07 DIAGNOSIS — S20469A Insect bite (nonvenomous) of unspecified back wall of thorax, initial encounter: Secondary | ICD-10-CM

## 2021-07-07 DIAGNOSIS — D229 Melanocytic nevi, unspecified: Secondary | ICD-10-CM

## 2021-07-07 DIAGNOSIS — Z1283 Encounter for screening for malignant neoplasm of skin: Secondary | ICD-10-CM

## 2021-07-07 DIAGNOSIS — Z86018 Personal history of other benign neoplasm: Secondary | ICD-10-CM

## 2021-07-07 DIAGNOSIS — L814 Other melanin hyperpigmentation: Secondary | ICD-10-CM

## 2021-07-07 DIAGNOSIS — L821 Other seborrheic keratosis: Secondary | ICD-10-CM

## 2021-07-07 DIAGNOSIS — I8393 Asymptomatic varicose veins of bilateral lower extremities: Secondary | ICD-10-CM

## 2021-07-07 DIAGNOSIS — D2261 Melanocytic nevi of right upper limb, including shoulder: Secondary | ICD-10-CM

## 2021-07-07 NOTE — Progress Notes (Signed)
Follow-Up Visit   Subjective  Jenna Mccarty is a 46 y.o. female who presents for the following: Annual Exam (The patient presents for Total-Body Skin Exam (TBSE) for skin cancer screening and mole check.  The patient has spots, moles and lesions to be evaluated, some may be new or changing and the patient has concerns that these could be cancer. Patient with hx of dysplastic nevi treated by Dr. Phillip Heal. She does have a spot below bra strap that feels funny to patient and a bug bite at upper back that has been there for a few weeks. ).  It is getting better.   The following portions of the chart were reviewed this encounter and updated as appropriate:       Review of Systems:  No other skin or systemic complaints except as noted in HPI or Assessment and Plan.  Objective  Well appearing patient in no apparent distress; mood and affect are within normal limits.  A full examination was performed including scalp, head, eyes, ears, nose, lips, neck, chest, axillae, abdomen, back, buttocks, bilateral upper extremities, bilateral lower extremities, hands, feet, fingers, toes, fingernails, and toenails. All findings within normal limits unless otherwise noted below.  Left Abdomen, right chest, right elbow Right chest: 4.14m brown flat pap, darker at inferior edge   Right Elbow: 4.063mbrown pap   Left Abdomen: 4.0 x 3.35m54med dark brown macule  Left Spinal Mid Back: 5 mm pink flesh papule  spinal upper back Pink patch left upper back/post shoulder    Assessment & Plan  Nevus Left Abdomen, right chest, right elbow  Benign-appearing.  Stable. Observation.  Call clinic for new or changing moles.  Recommend daily use of broad spectrum spf 30+ sunscreen to sun-exposed areas.  Discussed shave removal irritated nevus L mid back, pt defers at this time  Reaction to insect bite spinal upper back  Resolving   Patient defers rx for itch, will use OTC HC if needed.    Lentigines - Scattered tan macules - Due to sun exposure - Benign-appearing, observe - Recommend daily broad spectrum sunscreen SPF 30+ to sun-exposed areas, reapply every 2 hours as needed. - Call for any changes  Seborrheic Keratoses - Stuck-on, waxy, tan-brown papules and/or plaques  - Benign-appearing - Discussed benign etiology and prognosis. - Observe - Call for any changes  Melanocytic Nevi - Tan-brown and/or pink-flesh-colored symmetric macules and papules - Benign appearing on exam today - Observation - Call clinic for new or changing moles - Recommend daily use of broad spectrum spf 30+ sunscreen to sun-exposed areas.   Hemangiomas - Red papules - Discussed benign nature - Observe - Call for any changes  Actinic Damage - Chronic condition, secondary to cumulative UV/sun exposure - diffuse scaly erythematous macules with underlying dyspigmentation - Recommend daily broad spectrum sunscreen SPF 30+ to sun-exposed areas, reapply every 2 hours as needed.  - Staying in the shade or wearing long sleeves, sun glasses (UVA+UVB protection) and wide brim hats (4-inch brim around the entire circumference of the hat) are also recommended for sun protection.  - Call for new or changing lesions.  Skin cancer screening performed today.  Varicose Veins/Spider Veins - Dilated blue, purple or red veins at the lower extremities - Reassured - Smaller vessels can be treated by sclerotherapy (a procedure to inject a medicine into the veins to make them disappear) if desired, but the treatment is not covered by insurance. Larger vessels may be covered if symptomatic and we  would refer to vascular surgeon if treatment desired.  History of Dysplastic Nevi - No evidence of recurrence today at Left Lower pretibia, L vulva - Recommend regular full body skin exams - Recommend daily broad spectrum sunscreen SPF 30+ to sun-exposed areas, reapply every 2 hours as needed.  - Call if any new  or changing lesions are noted between office visits  Return in about 1 year (around 07/08/2022) for TBSE.  Graciella Belton, RMA, am acting as scribe for Brendolyn Patty, MD .  Documentation: I have reviewed the above documentation for accuracy and completeness, and I agree with the above.  Brendolyn Patty MD

## 2021-07-07 NOTE — Patient Instructions (Signed)
Melanoma ABCDEs  Melanoma is the most dangerous type of skin cancer, and is the leading cause of death from skin disease.  You are more likely to develop melanoma if you: Have light-colored skin, light-colored eyes, or red or blond hair Spend a lot of time in the sun Tan regularly, either outdoors or in a tanning bed Have had blistering sunburns, especially during childhood Have a close family member who has had a melanoma Have atypical moles or large birthmarks  Early detection of melanoma is key since treatment is typically straightforward and cure rates are extremely high if we catch it early.   The first sign of melanoma is often a change in a mole or a new dark spot.  The ABCDE system is a way of remembering the signs of melanoma.  A for asymmetry:  The two halves do not match. B for border:  The edges of the growth are irregular. C for color:  A mixture of colors are present instead of an even brown color. D for diameter:  Melanomas are usually (but not always) greater than 74m - the size of a pencil eraser. E for evolution:  The spot keeps changing in size, shape, and color.  Please check your skin once per month between visits. You can use a small mirror in front and a large mirror behind you to keep an eye on the back side or your body.   If you see any new or changing lesions before your next follow-up, please call to schedule a visit.  Please continue daily skin protection including broad spectrum sunscreen SPF 30+ to sun-exposed areas, reapplying every 2 hours as needed when you're outdoors.    If You Need Anything After Your Visit  If you have any questions or concerns for your doctor, please call our main line at [phone] press option 4 to reach your doctor's medical assistant. If no one answers, please leave a voicemail as directed and we will return your call as soon as possible. Messages left after 4 pm will be answered the following business day.   You may also  send [place] message via [place] We typically respond to MyChart messages within 1-2 business days.  For prescription refills, please ask your pharmacy to contact our office. Our fax number is [phone]  If you have an urgent issue when the clinic is closed that cannot wait until the next business day, you can page your doctor at the number below.    Please note that while we do our best to be available for urgent issues outside of office hours, we are not available 24/7.   If you have an urgent issue and are unable to reach [place] you may choose to seek medical care at your doctor's office, retail clinic, urgent care center, or emergency room.  If you have a medical emergency, please immediately call 911 or go to the emergency department.  Pager Numbers  - Dr. KNehemiah Massed [phone] - Dr. MLaurence Ferrari [phone] - Dr. SNicole Kindred [phone] In the event of inclement weather, please call our main line at [phone] an update on the status of any delays or closures.  Dermatology Medication Tips: Please keep the boxes that topical medications come in in order to help keep track of the instructions about where and how to use these. Pharmacies typically print the medication instructions only on the boxes and not directly on the medication tubes.   If your medication is too expensive, please contact  our office at [phone] option 4 or send [place] a message through [place].   We are unable to tell what your co-pay for medications will be in advance as this is different depending on your insurance coverage. However, we may be able to find a substitute medication at lower cost or fill out paperwork to get insurance to cover a needed medication.   If a prior authorization is required to get your medication covered by your insurance company, please allow [place] 1-2 business days to complete this process.  Drug prices often vary depending on where the prescription is filled and some pharmacies may  offer cheaper prices.  The website www.goodrx.com contains coupons for medications through different pharmacies. The prices here do not account for what the cost may be with help from insurance (it may be cheaper with your insurance), but the website can give you the price if you did not use any insurance.  - You can print the associated coupon and take it with your prescription to the pharmacy.  - You may also stop by our office during regular business hours and pick up a GoodRx coupon card.  - If you need your prescription sent electronically to a different pharmacy, notify our office through [facility] or by phone at [phone] option 4.     Si Usted Necesita Algo Despus de Su Visita  Tambin puede enviarnos un mensaje a travs de Pharmacist, community. Por lo general respondemos a los mensajes de MyChart en el transcurso de 1 a 2 das hbiles.  Para renovar recetas, por favor pida a su farmacia que se ponga en contacto con nuestra oficina. Harland Dingwall de fax es [place] [phone].  Si tiene un asunto urgente cuando la clnica est cerrada y que no puede esperar hasta el siguiente da hbil, puede llamar/localizar a su doctor(a) al nmero que aparece a continuacin.   Por favor, tenga en cuenta que aunque hacemos todo lo posible para estar disponibles para asuntos urgentes fuera del horario de [place], no estamos disponibles las 24 horas del da, los 7 das de la [place].   Si tiene un problema urgente y no puede comunicarse con nosotros, puede optar por buscar atencin mdica  en el consultorio de su doctor(a), en una clnica privada, en un centro de atencin urgente o en una sala de emergencias.  Si tiene Engineering geologist, por favor llame inmediatamente al 911 o vaya a la sala de emergencias.  Nmeros de bper  - Dr. Nehemiah Massed: [phone]  - Dra. Moye: [phone]  - Dra. Nicole Kindred: [phone]  En caso de inclemencias del [place], por favor llame a Johnsie Kindred principal al  [phone] para una actualizacin sobre el [place] de cualquier retraso o cierre.  Consejos para la medicacin en dermatologa: Por favor, guarde las cajas en las que vienen los medicamentos de uso tpico para ayudarle a seguir las instrucciones sobre dnde y cmo usarlos. Las farmacias generalmente imprimen las instrucciones del medicamento slo en las cajas y no directamente en los tubos del [place].   Si su medicamento es muy caro, por favor, pngase en contacto con Zigmund Daniel llamando al [phone] y presione la opcin 4 o envenos un mensaje a travs de Pharmacist, community.   No podemos decirle cul ser su copago por los medicamentos por adelantado ya que esto es diferente dependiendo de la cobertura de su seguro. Sin embargo, es posible que podamos encontrar un medicamento sustituto a Electrical engineer un formulario para que el seguro cubra el medicamento  que se considera necesario.   Si se requiere una autorizacin previa para que su compaa de seguros [place] su medicamento, por favor permtanos de 1 a 2 das hbiles para completar este proceso.  Los precios de los medicamentos varan con frecuencia dependiendo del Environmental consultant de dnde se surte la receta y alguna farmacias pueden ofrecer precios ms baratos.  El sitio web www.goodrx.com tiene cupones para medicamentos de Airline pilot. Los precios aqu no tienen en cuenta lo que podra costar con la ayuda del seguro (puede ser ms barato con su seguro), pero el sitio web puede darle el precio si no utiliz Research scientist (physical sciences).  - Puede imprimir el cupn correspondiente y llevarlo con su receta a la farmacia.  - Tambin puede pasar por nuestra oficina durante el horario de atencin regular y Charity fundraiser una tarjeta de cupones de GoodRx.  - Si necesita que su receta se enve electrnicamente a una farmacia diferente, informe a nuestra oficina a travs de MyChart de [place] o por telfono llamando al [phone] y presione la opcin 4.

## 2021-11-26 ENCOUNTER — Encounter: Payer: Self-pay | Admitting: Podiatry

## 2021-11-26 ENCOUNTER — Ambulatory Visit: Payer: BC Managed Care – PPO | Admitting: Podiatry

## 2021-11-26 ENCOUNTER — Ambulatory Visit (INDEPENDENT_AMBULATORY_CARE_PROVIDER_SITE_OTHER): Payer: BC Managed Care – PPO

## 2021-11-26 DIAGNOSIS — M778 Other enthesopathies, not elsewhere classified: Secondary | ICD-10-CM

## 2021-11-26 MED ORDER — TRIAMCINOLONE ACETONIDE 40 MG/ML IJ SUSP
20.0000 mg | Freq: Once | INTRAMUSCULAR | Status: AC
  Administered 2021-11-26: 20 mg

## 2021-11-26 MED ORDER — METHYLPREDNISOLONE 4 MG PO TBPK: ORAL_TABLET | ORAL | 0 refills | Status: DC

## 2021-11-26 MED ORDER — MELOXICAM 15 MG PO TABS: 15.0000 mg | ORAL_TABLET | Freq: Every day | ORAL | 3 refills | Status: DC

## 2021-11-26 NOTE — Progress Notes (Signed)
Subjective:  Patient ID: Jenna Mccarty, female    DOB: 1975-03-11,  MRN: [id number] HPI Chief Complaint  Patient presents with   Foot Pain    Forefoot left - aching, jammed toe with freezer drawer few weeks ago, tried Advil PRN, had pain with 1st MPJ intermittently x years   New Patient (Initial Visit)    46 y.o. female presents with the above complaint.   ROS: Denies fever chills nausea vomit muscle aches pains calf pain back pain chest pain shortness of breath.  Past Medical History:  Diagnosis Date   Cervical dysplasia    Hx of dysplastic nevus ~2018   L lower pretibia, txted by Dr. Phillip Heal   Hx of dysplastic nevus ~2018   L vulva, txted by Dr. Phillip Heal   Past Surgical History:  Procedure Laterality Date   COLPOSCOPY     KNEE SURGERY     TONSILLECTOMY     WISDOM TOOTH EXTRACTION      Current Outpatient Medications:    meloxicam (MOBIC) 15 MG tablet, Take 1 tablet (15 mg total) by mouth daily., Disp: 30 tablet, Rfl: 3   methylPREDNISolone (MEDROL DOSEPAK) 4 MG TBPK tablet, 6 day dose pack - take as directed, Disp: 21 tablet, Rfl: 0   cetirizine (ZYRTEC) 10 MG tablet, Take by mouth., Disp: , Rfl:    Multiple Vitamins-Minerals (MULTIVITAMIN PO), Take by mouth., Disp: , Rfl:    norgestimate-ethinyl estradiol (ESTARYLLA) 0.25-35 MG-MCG tablet, Take 1 tablet by mouth daily., Disp: 84 tablet, Rfl: 3   omeprazole (PRILOSEC) 20 MG capsule, Take by mouth., Disp: , Rfl:   Allergies  Allergen Reactions   Penicillins Hives   Review of Systems Objective:  There were no vitals filed for this visit.  General: Well developed, nourished, in no acute distress, alert and oriented x3   Dermatological: Skin is warm, dry and supple bilateral. Nails x 10 are well maintained; remaining integument appears unremarkable at this time. There are no open sores, no preulcerative lesions, no rash or signs of infection present.  Vascular: Dorsalis Pedis artery and Posterior Tibial artery  pedal pulses are 2/4 bilateral with immedate capillary fill time. Pedal hair growth present. No varicosities and no lower extremity edema present bilateral.   Neruologic: Grossly intact via light touch bilateral. Vibratory intact via tuning fork bilateral. Protective threshold with Semmes Wienstein monofilament intact to all pedal sites bilateral. Patellar and Achilles deep tendon reflexes 2+ bilateral. No Babinski or clonus noted bilateral.   Musculoskeletal: No gross boney pedal deformities bilateral. No pain, crepitus, or limitation noted with foot and ankle range of motion bilateral. Muscular strength 5/5 in all groups tested bilateral.  Severely limited first metatarsophalangeal joint range of motion particularly dorsiflexion.  She has pain on palpation and end range of motion of the second metatarsophalangeal joint of the left foot with a mild diastases and medial deviation of the second toe.    Gait: Unassisted, Nonantalgic.    Radiographs:  Radiographs taken today demonstrate an osseously mature foot.  She has significant osteoarthritis with a mild elevated first metatarsal.  Arthritis is demonstrated at the first metatarsophalangeal joint with dorsal spurring as well.  Eccentric narrowing of the joint subchondral sclerosis.  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  No acute findings are identified.  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  Plan: Discussed etiology pathology conservative versus surgical therapies.  Injected around the second metatarsophalangeal joint today put her on a steroidal anti-inflammatory followed by meloxicam.  Discussed appropriate shoe gear stretching exercise ice therapy and shoe gear  modifications we will follow-up with her in 1 month.     Jenna Mccarty T. [place], [place]

## 2022-01-05 ENCOUNTER — Ambulatory Visit: Payer: BC Managed Care – PPO | Admitting: Podiatry

## 2022-03-25 ENCOUNTER — Other Ambulatory Visit: Payer: Self-pay | Admitting: Podiatry

## 2022-04-23 ENCOUNTER — Other Ambulatory Visit: Payer: Self-pay | Admitting: Podiatry

## 2022-05-06 NOTE — Progress Notes (Unsigned)
PCP:  Ellene Route   No chief complaint on file.    HPI:      [name] is a 47 y.o. G0P0000 who LMP was No LMP recorded., presents today for her annual examination.  Her menses are regular every 28-30 days, lasting 4 days on OCPs.  Dysmenorrhea none. She does not have intermenstrual bleeding.  Sex activity: single partner, contraception - OCP (estrogen/progesterone).  Last Pap: 02/23/18  Results were: no abnormalities /neg HPV DNA  Hx of STDs: HPV on pap  Mammogram: 06/23/21 Results no abnormalities, repeat in 12 months There is a FH of breast cancer in her pat grt aunt, genetic testing not indicated. There is no FH of ovarian cancer. The patient does do self-breast exams.  Tobacco use: The patient denies current or previous tobacco use. Alcohol use: social drinker No drug use.  Exercise: mod active  She does get adequate calcium and Vitamin D in her diet.  Borderline lipids last yr, due for repeat labs this yr.  Pt with worsening SUI. Sx with sneeze and cough, not jumping/exercise. Hasn't tried kegels. Drinks 1 caffeinated drink daily.  Past Medical History:  Diagnosis Date   Cervical dysplasia    Hx of dysplastic nevus ~2018   L lower pretibia, txted by Dr. Phillip Heal   Hx of dysplastic nevus ~2018   L vulva, txted by Dr. Phillip Heal    Past Surgical History:  Procedure Laterality Date   COLPOSCOPY     KNEE SURGERY     TONSILLECTOMY     WISDOM TOOTH EXTRACTION      Family History  Problem Relation Age of Onset   Diabetes Mellitus II Mother    Diabetes Mellitus II Maternal Grandfather    Diabetes Mellitus II Paternal Grandmother    Cancer Other 58       Breast   Breast cancer Paternal Aunt        pat great aunt    Social History   Socioeconomic History   Marital status: Married    Spouse name: Not on file   Number of children: Not on file   Years of education: Not on file   Highest education level: Not on file  Occupational History    Not on file  Tobacco Use   Smoking status: Never   Smokeless tobacco: Never  Vaping Use   Vaping Use: Never used  Substance and Sexual Activity   Alcohol use: Yes    Comment: social   Drug use: No   Sexual activity: Yes    Birth control/protection: Pill  Other Topics Concern   Not on file  Social History Narrative   Not on file   Social Determinants of Health   Financial Resource Strain: Not on file  Food Insecurity: Not on file  Transportation Needs: Not on file  Physical Activity: Not on file  Stress: Not on file  Social Connections: Not on file  Intimate Partner Violence: Not on file    No outpatient medications have been marked as taking for the 05/07/22 encounter (Appointment) with Dayona Shaheen, Deirdre Evener, PA-C.     ROS:  Review of Systems  Constitutional:  Negative for fatigue, fever and unexpected weight change.  Respiratory:  Negative for cough, shortness of breath and wheezing.   Cardiovascular:  Negative for chest pain, palpitations and leg swelling.  Gastrointestinal:  Negative for blood in stool, constipation, diarrhea, nausea and vomiting.  Endocrine: Negative for cold intolerance, heat intolerance and polyuria.  Genitourinary:  Negative for dyspareunia, dysuria, flank pain, frequency, genital sores, hematuria, menstrual problem, pelvic pain, urgency, vaginal bleeding, vaginal discharge and vaginal pain.  Musculoskeletal:  Negative for back pain, joint swelling and myalgias.  Skin:  Negative for rash.  Neurological:  Negative for dizziness, syncope, light-headedness, numbness and headaches.  Hematological:  Negative for adenopathy.  Psychiatric/Behavioral:  Negative for agitation, confusion, sleep disturbance and suicidal ideas. The patient is not nervous/anxious.      Objective: There were no vitals taken for this visit.   Physical Exam Constitutional:      Appearance: She is well-developed.  Genitourinary:     Vulva normal.     Right Labia: No  rash, tenderness or lesions.    Left Labia: No tenderness, lesions or rash.    No vaginal discharge, erythema or tenderness.      Right Adnexa: not tender and no mass present.    Left Adnexa: not tender and no mass present.    No cervical motion tenderness, friability or polyp.     Uterus is not enlarged or tender.  Breasts:    Right: No mass, nipple discharge, skin change or tenderness.     Left: No mass, nipple discharge, skin change or tenderness.  Neck:     Thyroid: No thyromegaly.  Cardiovascular:     Rate and Rhythm: Normal rate and regular rhythm.     Heart sounds: Normal heart sounds. No murmur heard. Pulmonary:     Effort: Pulmonary effort is normal.     Breath sounds: Normal breath sounds.  Abdominal:     Palpations: Abdomen is soft.     Tenderness: There is no abdominal tenderness. There is no guarding or rebound.  Musculoskeletal:        General: Normal range of motion.     Cervical back: Normal range of motion.  Lymphadenopathy:     Cervical: No cervical adenopathy.  Neurological:     General: No focal deficit present.     Mental Status: She is alert and oriented to person, place, and time.     Cranial Nerves: No cranial nerve deficit.  Skin:    General: Skin is warm and dry.  Psychiatric:        Mood and Affect: Mood normal.        Behavior: Behavior normal.        Thought Content: Thought content normal.        Judgment: Judgment normal.  Vitals reviewed.     Assessment/Plan: Encounter for annual routine gynecological examination  Encounter for screening mammogram for malignant neoplasm of breast - Plan: MM 3D SCREEN BREAST BILATERAL; pt schedule mammo  Encounter for surveillance of contraceptive pills - Plan: norgestimate-ethinyl estradiol (ESTARYLLA) 0.25-35 MG-MCG tablet; OCP RF  Elevated lipids - Plan: Lipid panel; check lipids  Blood tests for routine general physical examination - Plan: Comprehensive metabolic panel  SUI (stress urinary  incontinence, female) - Plan: Ambulatory referral to Physical Therapy; start kegels, refer to pelvic PT   No orders of the defined types were placed in this encounter.            GYN counsel adequate intake of calcium and vitamin D, diet and exercise     F/U  No follow-ups on file.  Dorethy Tomey B. Sami Roes, PA-C 05/06/2022 5:17 PM

## 2022-05-07 ENCOUNTER — Other Ambulatory Visit (HOSPITAL_COMMUNITY)
Admission: RE | Admit: 2022-05-07 | Discharge: 2022-05-07 | Disposition: A | Discharge status: Home / Self Care | Payer: No Typology Code available for payment source | Source: Ambulatory Visit | Attending: Obstetrics and Gynecology | Admitting: Obstetrics and Gynecology

## 2022-05-07 ENCOUNTER — Encounter: Payer: Self-pay | Admitting: Obstetrics and Gynecology

## 2022-05-07 ENCOUNTER — Ambulatory Visit (INDEPENDENT_AMBULATORY_CARE_PROVIDER_SITE_OTHER): Payer: No Typology Code available for payment source | Admitting: Obstetrics and Gynecology

## 2022-05-07 VITALS — BP 118/70 | Ht 65.0 in | Wt 218.0 lb

## 2022-05-07 DIAGNOSIS — Z124 Encounter for screening for malignant neoplasm of cervix: Secondary | ICD-10-CM

## 2022-05-07 DIAGNOSIS — Z1231 Encounter for screening mammogram for malignant neoplasm of breast: Secondary | ICD-10-CM

## 2022-05-07 DIAGNOSIS — Z01419 Encounter for gynecological examination (general) (routine) without abnormal findings: Secondary | ICD-10-CM

## 2022-05-07 DIAGNOSIS — E785 Hyperlipidemia, unspecified: Secondary | ICD-10-CM

## 2022-05-07 DIAGNOSIS — Z13220 Encounter for screening for lipoid disorders: Secondary | ICD-10-CM

## 2022-05-07 DIAGNOSIS — Z6836 Body mass index (BMI) 36.0-36.9, adult: Secondary | ICD-10-CM

## 2022-05-07 DIAGNOSIS — Z131 Encounter for screening for diabetes mellitus: Secondary | ICD-10-CM

## 2022-05-07 DIAGNOSIS — Z1151 Encounter for screening for human papillomavirus (HPV): Secondary | ICD-10-CM | POA: Diagnosis present

## 2022-05-07 DIAGNOSIS — Z3041 Encounter for surveillance of contraceptive pills: Secondary | ICD-10-CM

## 2022-05-07 DIAGNOSIS — Z0000 Encounter for general adult medical examination without abnormal findings: Secondary | ICD-10-CM

## 2022-05-07 MED ORDER — NORGESTIMATE-ETH ESTRADIOL 0.25-35 MG-MCG PO TABS: 1.0000 | ORAL_TABLET | Freq: Every day | ORAL | 3 refills | Status: DC

## 2022-05-07 NOTE — Patient Instructions (Addendum)
I value your feedback and you entrusting us with your care. If you get a [place] patient survey, I would appreciate you taking the time to let us know about your experience today. Thank you!  [facility]: [phone]      

## 2022-05-08 LAB — COMPREHENSIVE METABOLIC PANEL
ALT: 27 IU/L (ref 0–32)
AST: 26 IU/L (ref 0–40)
Albumin/Globulin Ratio: 1.6 (ref 1.2–2.2)
Albumin: 4.3 g/dL (ref 3.9–4.9)
Alkaline Phosphatase: 83 IU/L (ref 44–121)
BUN/Creatinine Ratio: 19 (ref 9–23)
BUN: 17 mg/dL (ref 6–24)
Bilirubin Total: 0.3 mg/dL (ref 0.0–1.2)
CO2: 23 mmol/L (ref 20–29)
Calcium: 9.3 mg/dL (ref 8.7–10.2)
Chloride: 102 mmol/L (ref 96–106)
Creatinine, Ser: 0.88 mg/dL (ref 0.57–1.00)
Globulin, Total: 2.7 g/dL (ref 1.5–4.5)
Glucose: 91 mg/dL (ref 70–99)
Potassium: 4.1 mmol/L (ref 3.5–5.2)
Sodium: 140 mmol/L (ref 134–144)
Total Protein: 7 g/dL (ref 6.0–8.5)
eGFR: 83 mL/min/{1.73_m2} (ref >59)

## 2022-05-08 LAB — LIPID PANEL WITH LDL/HDL RATIO
Cholesterol, Total: 237 mg/dL — ABNORMAL HIGH (ref 100–199)
HDL: 59 mg/dL (ref >39)
LDL Chol Calc (NIH): 149 mg/dL — ABNORMAL HIGH (ref 0–99)
LDL/HDL Ratio: 2.5 ratio (ref 0.0–3.2)
Triglycerides: 162 mg/dL — ABNORMAL HIGH (ref 0–149)
VLDL Cholesterol Cal: 29 mg/dL (ref 5–40)

## 2022-05-08 LAB — HEMOGLOBIN A1C
Est. average glucose Bld gHb Est-mCnc: 120 mg/dL
Hgb A1c MFr Bld: 5.8 % — ABNORMAL HIGH (ref 4.8–5.6)

## 2022-05-10 ENCOUNTER — Other Ambulatory Visit: Payer: Self-pay | Admitting: Obstetrics and Gynecology

## 2022-05-10 DIAGNOSIS — Z3041 Encounter for surveillance of contraceptive pills: Secondary | ICD-10-CM

## 2022-05-11 LAB — CYTOLOGY - PAP
Comment: NEGATIVE
Diagnosis: UNDETERMINED — AB
High risk HPV: POSITIVE — AB

## 2022-05-23 ENCOUNTER — Other Ambulatory Visit: Payer: Self-pay | Admitting: Podiatry

## 2022-06-29 ENCOUNTER — Ambulatory Visit
Admission: RE | Admit: 2022-06-29 | Discharge: 2022-06-29 | Disposition: A | Discharge status: Home / Self Care | Payer: 59 | Source: Ambulatory Visit | Attending: Obstetrics and Gynecology | Admitting: Obstetrics and Gynecology

## 2022-06-29 DIAGNOSIS — Z1231 Encounter for screening mammogram for malignant neoplasm of breast: Secondary | ICD-10-CM

## 2022-07-14 ENCOUNTER — Ambulatory Visit: Payer: 59 | Admitting: Dermatology

## 2022-07-14 ENCOUNTER — Encounter: Payer: Self-pay | Admitting: Dermatology

## 2022-07-14 VITALS — BP 125/76

## 2022-07-14 DIAGNOSIS — Z86018 Personal history of other benign neoplasm: Secondary | ICD-10-CM

## 2022-07-14 DIAGNOSIS — L814 Other melanin hyperpigmentation: Secondary | ICD-10-CM

## 2022-07-14 DIAGNOSIS — Z1283 Encounter for screening for malignant neoplasm of skin: Secondary | ICD-10-CM

## 2022-07-14 DIAGNOSIS — L578 Other skin changes due to chronic exposure to nonionizing radiation: Secondary | ICD-10-CM

## 2022-07-14 DIAGNOSIS — D225 Melanocytic nevi of trunk: Secondary | ICD-10-CM | POA: Diagnosis not present

## 2022-07-14 DIAGNOSIS — L821 Other seborrheic keratosis: Secondary | ICD-10-CM

## 2022-07-14 DIAGNOSIS — D2261 Melanocytic nevi of right upper limb, including shoulder: Secondary | ICD-10-CM

## 2022-07-14 DIAGNOSIS — W908XXA Exposure to other nonionizing radiation, initial encounter: Secondary | ICD-10-CM

## 2022-07-14 DIAGNOSIS — D229 Melanocytic nevi, unspecified: Secondary | ICD-10-CM

## 2022-07-14 DIAGNOSIS — X32XXXA Exposure to sunlight, initial encounter: Secondary | ICD-10-CM

## 2022-07-14 NOTE — Patient Instructions (Signed)
Due to recent changes in healthcare laws, you may see results of your pathology and/or laboratory studies on MyChart before the doctors have had a chance to review them. We understand that in some cases there may be results that are confusing or concerning to you. Please understand that not all results are received at the same time and often the doctors may need to interpret multiple results in order to provide you with the best plan of care or course of treatment. Therefore, we ask that you please give us 2 business days to thoroughly review all your results before contacting the office for clarification. Should we see a critical lab result, you will be contacted sooner.   If You Need Anything After Your Visit  If you have any questions or concerns for your doctor, please call our main line at [phone] and press option 4 to reach your doctor's medical assistant. If no one answers, please leave a voicemail as directed and we will return your call as soon as possible. Messages left after 4 pm will be answered the following business day.   You may also send us a message via MyChart. We typically respond to MyChart messages within 1-2 business days.  For prescription refills, please ask your pharmacy to contact our office. Our fax number is [phone].  If you have an urgent issue when the clinic is closed that cannot wait until the next business day, you can page your doctor at the number below.    Please note that while we do our best to be available for urgent issues outside of office hours, we are not available 24/7.   If you have an urgent issue and are unable to reach us, you may choose to seek medical care at your doctor's office, retail clinic, urgent care center, or emergency room.  If you have a medical emergency, please immediately call 911 or go to the emergency department.  Pager Numbers  - Dr. Kowalski: [phone]  - Dr. Moye: [phone]  - Dr. Stewart:  [phone]  In the event of inclement weather, please call our main line at [phone] for an update on the status of any delays or closures.  Dermatology Medication Tips: Please keep the boxes that topical medications come in in order to help keep track of the instructions about where and how to use these. Pharmacies typically print the medication instructions only on the boxes and not directly on the medication tubes.   If your medication is too expensive, please contact our office at [phone] option 4 or send us a message through MyChart.   We are unable to tell what your co-pay for medications will be in advance as this is different depending on your insurance coverage. However, we may be able to find a substitute medication at lower cost or fill out paperwork to get insurance to cover a needed medication.   If a prior authorization is required to get your medication covered by your insurance company, please allow us 1-2 business days to complete this process.  Drug prices often vary depending on where the prescription is filled and some pharmacies may offer cheaper prices.  The website www.goodrx.com contains coupons for medications through different pharmacies. The prices here do not account for what the cost may be with help from insurance (it may be cheaper with your insurance), but the website can give you the price if you did not use any insurance.  - You can print the associated coupon and take it with   your prescription to the pharmacy.  - You may also stop by our office during regular business hours and pick up a GoodRx coupon card.  - If you need your prescription sent electronically to a different pharmacy, notify our office through [facility] MyChart or by phone at [phone] option 4.     Si Usted Necesita Algo Despus de Su Visita  Tambin puede enviarnos un mensaje a travs de MyChart. Por lo general respondemos a los mensajes de MyChart en el transcurso de 1 a 2  das hbiles.  Para renovar recetas, por favor pida a su farmacia que se ponga en contacto con nuestra oficina. Nuestro nmero de fax es el [phone].  Si tiene un asunto urgente cuando la clnica est cerrada y que no puede esperar hasta el siguiente da hbil, puede llamar/localizar a su doctor(a) al nmero que aparece a continuacin.   Por favor, tenga en cuenta que aunque hacemos todo lo posible para estar disponibles para asuntos urgentes fuera del horario de oficina, no estamos disponibles las 24 horas del da, los 7 das de la semana.   Si tiene un problema urgente y no puede comunicarse con nosotros, puede optar por buscar atencin mdica  en el consultorio de su doctor(a), en una clnica privada, en un centro de atencin urgente o en una sala de emergencias.  Si tiene una emergencia mdica, por favor llame inmediatamente al 911 o vaya a la sala de emergencias.  Nmeros de bper  - Dr. Kowalski: [phone]  - Dra. Moye: [phone]  - Dra. Stewart: [phone]  En caso de inclemencias del tiempo, por favor llame a nuestra lnea principal al [phone] para una actualizacin sobre el estado de cualquier retraso o cierre.  Consejos para la medicacin en dermatologa: Por favor, guarde las cajas en las que vienen los medicamentos de uso tpico para ayudarle a seguir las instrucciones sobre dnde y cmo usarlos. Las farmacias generalmente imprimen las instrucciones del medicamento slo en las cajas y no directamente en los tubos del medicamento.   Si su medicamento es muy caro, por favor, pngase en contacto con nuestra oficina llamando al [phone] y presione la opcin 4 o envenos un mensaje a travs de MyChart.   No podemos decirle cul ser su copago por los medicamentos por adelantado ya que esto es diferente dependiendo de la cobertura de su seguro. Sin embargo, es posible que podamos encontrar un medicamento sustituto a menor costo o llenar un formulario para que el  seguro cubra el medicamento que se considera necesario.   Si se requiere una autorizacin previa para que su compaa de seguros cubra su medicamento, por favor permtanos de 1 a 2 das hbiles para completar este proceso.  Los precios de los medicamentos varan con frecuencia dependiendo del lugar de dnde se surte la receta y alguna farmacias pueden ofrecer precios ms baratos.  El sitio web www.goodrx.com tiene cupones para medicamentos de diferentes farmacias. Los precios aqu no tienen en cuenta lo que podra costar con la ayuda del seguro (puede ser ms barato con su seguro), pero el sitio web puede darle el precio si no utiliz ningn seguro.  - Puede imprimir el cupn correspondiente y llevarlo con su receta a la farmacia.  - Tambin puede pasar por nuestra oficina durante el horario de atencin regular y recoger una tarjeta de cupones de GoodRx.  - Si necesita que su receta se enve electrnicamente a una farmacia diferente, informe a nuestra oficina a travs de MyChart de [place]   o por telfono llamando al [phone] y presione la opcin 4.  

## 2022-07-14 NOTE — Progress Notes (Signed)
   Follow-Up Visit   Subjective  Jenna Mccarty is a 47 y.o. female who presents for the following: Skin Cancer Screening and Full Body Skin Exam, hx of Dysplastic Nevi, recheck nevus at L spinal mid back, itchy prn  The patient presents for Total-Body Skin Exam (TBSE) for skin cancer screening and mole check. The patient has spots, moles and lesions to be evaluated, some may be new or changing and the patient has concerns that these could be cancer.    The following portions of the chart were reviewed this encounter and updated as appropriate: medications, allergies, medical history  Review of Systems:  No other skin or systemic complaints except as noted in HPI or Assessment and Plan.  Objective  Well appearing patient in no apparent distress; mood and affect are within normal limits.  A full examination was performed including scalp, head, eyes, ears, nose, lips, neck, chest, axillae, abdomen, back, buttocks, bilateral upper extremities, bilateral lower extremities, hands, feet, fingers, toes, fingernails, and toenails. All findings within normal limits unless otherwise noted below.   Relevant physical exam findings are noted in the Assessment and Plan.    Assessment & Plan   LENTIGINES, SEBORRHEIC KERATOSES, HEMANGIOMAS - Benign normal skin lesions - Benign-appearing - Call for any changes  MELANOCYTIC NEVI - Tan-brown and/or pink-flesh-colored symmetric macules and papules - Benign appearing on exam today - Observation - Call clinic for new or changing moles - Recommend daily use of broad spectrum spf 30+ sunscreen to sun-exposed areas.  NEVI Exam:  - R chest - 4.63mm brown flat pap, darker at inf edge - R elbow - 4.23mm brown pap - L abdomen - 4.0 x 3.34mm med dark brown macule - L spinal mid back - 5.68mm light brown pap, discussed shave removal if irritated, pt defers  Treatment Plan: Benign-appearing. Stable compared to previous visit. Observation.  Call clinic  for new or changing moles.  Recommend daily use of broad spectrum spf 30+ sunscreen to sun-exposed areas.  ACTINIC DAMAGE - Chronic condition, secondary to cumulative UV/sun exposure - diffuse scaly erythematous macules with underlying dyspigmentation - Recommend daily broad spectrum sunscreen SPF 30+ to sun-exposed areas, reapply every 2 hours as needed.  - Staying in the shade or wearing long sleeves, sun glasses (UVA+UVB protection) and wide brim hats (4-inch brim around the entire circumference of the hat) are also recommended for sun protection.  - Call for new or changing lesions.  SKIN CANCER SCREENING PERFORMED TODAY.  HISTORY OF DYSPLASTIC NEVUS No evidence of recurrence today Recommend regular full body skin exams Recommend daily broad spectrum sunscreen SPF 30+ to sun-exposed areas, reapply every 2 hours as needed.  Call if any new or changing lesions are noted between office visits  - L lower pretibia, L vulva, L upper abdomen       Return in about 1 year (around 07/14/2023) for TBSE, Hx of Dysplastic nevi.  I, Ardis Rowan, RMA, am acting as scribe for Willeen Niece, MD .   Documentation: I have reviewed the above documentation for accuracy and completeness, and I agree with the above.  Willeen Niece, MD

## 2022-12-18 DIAGNOSIS — I2699 Other pulmonary embolism without acute cor pulmonale: Secondary | ICD-10-CM

## 2022-12-18 HISTORY — DX: Other pulmonary embolism without acute cor pulmonale: I26.99

## 2022-12-28 ENCOUNTER — Emergency Department: Payer: No Typology Code available for payment source

## 2022-12-28 ENCOUNTER — Ambulatory Visit: Payer: Self-pay | Admitting: *Deleted

## 2022-12-28 ENCOUNTER — Inpatient Hospital Stay
Admission: EM | Admit: 2022-12-28 | Discharge: 2022-12-30 | DRG: 164 | Disposition: A | Discharge status: Home / Self Care | Payer: No Typology Code available for payment source | Attending: Internal Medicine | Admitting: Internal Medicine

## 2022-12-28 ENCOUNTER — Ambulatory Visit
Admission: EM | Admit: 2022-12-28 | Discharge: 2022-12-28 | Disposition: A | Discharge status: Home / Self Care | Payer: No Typology Code available for payment source | Attending: Family Medicine | Admitting: Family Medicine

## 2022-12-28 ENCOUNTER — Other Ambulatory Visit: Payer: Self-pay

## 2022-12-28 ENCOUNTER — Inpatient Hospital Stay: Payer: No Typology Code available for payment source

## 2022-12-28 ENCOUNTER — Encounter: Payer: Self-pay | Admitting: Emergency Medicine

## 2022-12-28 DIAGNOSIS — R0602 Shortness of breath: Secondary | ICD-10-CM

## 2022-12-28 DIAGNOSIS — I82432 Acute embolism and thrombosis of left popliteal vein: Secondary | ICD-10-CM | POA: Diagnosis present

## 2022-12-28 DIAGNOSIS — E669 Obesity, unspecified: Secondary | ICD-10-CM | POA: Diagnosis present

## 2022-12-28 DIAGNOSIS — I2489 Other forms of acute ischemic heart disease: Secondary | ICD-10-CM | POA: Diagnosis present

## 2022-12-28 DIAGNOSIS — I214 Non-ST elevation (NSTEMI) myocardial infarction: Secondary | ICD-10-CM | POA: Insufficient documentation

## 2022-12-28 DIAGNOSIS — Z309 Encounter for contraceptive management, unspecified: Secondary | ICD-10-CM

## 2022-12-28 DIAGNOSIS — I824Z2 Acute embolism and thrombosis of unspecified deep veins of left distal lower extremity: Secondary | ICD-10-CM | POA: Diagnosis present

## 2022-12-28 DIAGNOSIS — Z791 Long term (current) use of non-steroidal anti-inflammatories (NSAID): Secondary | ICD-10-CM | POA: Diagnosis not present

## 2022-12-28 DIAGNOSIS — R000 Tachycardia, unspecified: Secondary | ICD-10-CM

## 2022-12-28 DIAGNOSIS — Z7901 Long term (current) use of anticoagulants: Secondary | ICD-10-CM | POA: Diagnosis not present

## 2022-12-28 DIAGNOSIS — I2609 Other pulmonary embolism with acute cor pulmonale: Secondary | ICD-10-CM | POA: Diagnosis present

## 2022-12-28 DIAGNOSIS — I2699 Other pulmonary embolism without acute cor pulmonale: Principal | ICD-10-CM | POA: Diagnosis present

## 2022-12-28 DIAGNOSIS — Z803 Family history of malignant neoplasm of breast: Secondary | ICD-10-CM | POA: Diagnosis not present

## 2022-12-28 DIAGNOSIS — Z6833 Body mass index (BMI) 33.0-33.9, adult: Secondary | ICD-10-CM | POA: Diagnosis not present

## 2022-12-28 DIAGNOSIS — I2782 Chronic pulmonary embolism: Secondary | ICD-10-CM | POA: Diagnosis not present

## 2022-12-28 DIAGNOSIS — Z880 Allergy status to penicillin: Secondary | ICD-10-CM | POA: Diagnosis not present

## 2022-12-28 DIAGNOSIS — Z79899 Other long term (current) drug therapy: Secondary | ICD-10-CM

## 2022-12-28 DIAGNOSIS — I82412 Acute embolism and thrombosis of left femoral vein: Secondary | ICD-10-CM | POA: Diagnosis present

## 2022-12-28 DIAGNOSIS — Z833 Family history of diabetes mellitus: Secondary | ICD-10-CM

## 2022-12-28 DIAGNOSIS — Z8489 Family history of other specified conditions: Secondary | ICD-10-CM | POA: Diagnosis not present

## 2022-12-28 DIAGNOSIS — R079 Chest pain, unspecified: Secondary | ICD-10-CM

## 2022-12-28 DIAGNOSIS — I5A Non-ischemic myocardial injury (non-traumatic): Secondary | ICD-10-CM | POA: Insufficient documentation

## 2022-12-28 LAB — BASIC METABOLIC PANEL
Anion gap: 9 (ref 5–15)
BUN: 12 mg/dL (ref 6–20)
CO2: 21 mmol/L — ABNORMAL LOW (ref 22–32)
Calcium: 8.7 mg/dL — ABNORMAL LOW (ref 8.9–10.3)
Chloride: 107 mmol/L (ref 98–111)
Creatinine, Ser: 0.87 mg/dL (ref 0.44–1.00)
GFR, Estimated: >60 mL/min (ref >60)
Glucose, Bld: 122 mg/dL — ABNORMAL HIGH (ref 70–99)
Potassium: 3.7 mmol/L (ref 3.5–5.1)
Sodium: 137 mmol/L (ref 135–145)

## 2022-12-28 LAB — PROTIME-INR
INR: 1 (ref 0.8–1.2)
Prothrombin Time: 13.3 s (ref 11.4–15.2)

## 2022-12-28 LAB — CBC
HCT: 37.8 % (ref 36.0–46.0)
Hemoglobin: 12.3 g/dL (ref 12.0–15.0)
MCH: 29 pg (ref 26.0–34.0)
MCHC: 32.5 g/dL (ref 30.0–36.0)
MCV: 89.2 fL (ref 80.0–100.0)
Platelets: 241 10*3/uL (ref 150–400)
RBC: 4.24 MIL/uL (ref 3.87–5.11)
RDW: 12.7 % (ref 11.5–15.5)
WBC: 10.9 10*3/uL — ABNORMAL HIGH (ref 4.0–10.5)
nRBC: 0 % (ref 0.0–0.2)

## 2022-12-28 LAB — TROPONIN I (HIGH SENSITIVITY)
Troponin I (High Sensitivity): 255 ng/L (ref <18)
Troponin I (High Sensitivity): 253 ng/L (ref <18)
Troponin I (High Sensitivity): 145 ng/L (ref <18)

## 2022-12-28 LAB — HEPARIN LEVEL (UNFRACTIONATED): Heparin Unfractionated: <0.1 [IU]/mL — ABNORMAL LOW (ref 0.30–0.70)

## 2022-12-28 LAB — HCG, QUANTITATIVE, PREGNANCY: hCG, Beta Chain, Quant, S: <1 m[IU]/mL (ref <5)

## 2022-12-28 LAB — APTT: aPTT: 27 s (ref 24–36)

## 2022-12-28 MED ORDER — ONDANSETRON HCL 4 MG PO TABS: 4.0000 mg | ORAL_TABLET | Freq: Four times a day (QID) | ORAL | Status: DC | PRN

## 2022-12-28 MED ORDER — IOHEXOL 350 MG/ML SOLN
75.0000 mL | Freq: Once | INTRAVENOUS | Status: AC | PRN
  Administered 2022-12-28: 75 mL via INTRAVENOUS

## 2022-12-28 MED ORDER — HEPARIN BOLUS VIA INFUSION
2300.0000 [IU] | Freq: Once | INTRAVENOUS | Status: AC
  Administered 2022-12-28: 2300 [IU] via INTRAVENOUS
  Filled 2022-12-28: qty 2300

## 2022-12-28 MED ORDER — ONDANSETRON HCL 4 MG/2ML IJ SOLN: 4.0000 mg | Freq: Four times a day (QID) | INTRAMUSCULAR | Status: DC | PRN

## 2022-12-28 MED ORDER — SODIUM CHLORIDE 0.9 % IV SOLN: INTRAVENOUS | Status: DC

## 2022-12-28 MED ORDER — PANTOPRAZOLE SODIUM 40 MG PO TBEC
40.0000 mg | DELAYED_RELEASE_TABLET | Freq: Every day | ORAL | Status: DC
Start: 2022-12-28 — End: 2022-12-30
  Administered 2022-12-28 – 2022-12-30 (×3): 40 mg via ORAL
  Filled 2022-12-28 (×3): qty 1

## 2022-12-28 MED ORDER — HEPARIN (PORCINE) 25000 UT/250ML-% IV SOLN
1650.0000 [IU]/h | INTRAVENOUS | Status: DC
Start: 2022-12-28 — End: 2022-12-30
  Administered 2022-12-28: 900 [IU]/h via INTRAVENOUS
  Administered 2022-12-30: 1650 [IU]/h via INTRAVENOUS
  Filled 2022-12-28 (×3): qty 250

## 2022-12-28 MED ORDER — HEPARIN BOLUS VIA INFUSION
4000.0000 [IU] | Freq: Once | INTRAVENOUS | Status: AC
Start: 2022-12-28 — End: 2022-12-28
  Administered 2022-12-28: 4000 [IU] via INTRAVENOUS
  Filled 2022-12-28: qty 4000

## 2022-12-28 NOTE — Progress Notes (Signed)
PHARMACY - ANTICOAGULATION CONSULT NOTE  Pharmacy Consult for Heparin Infusion Indication: chest pain/ACS  Allergies  Allergen Reactions   Penicillins Hives    Patient Measurements: Height: 5\' 5"  (165.1 cm) Weight: 90.7 kg (200 lb) IBW/kg (Calculated) : 57 Heparin Dosing Weight: 77.1 kg  Vital Signs: Temp: 98.8 F (37.1 C) (11/11 1130) Temp Source: Oral (11/11 1130) BP: 147/105 (11/11 1235) Pulse Rate: 112 (11/11 1235)  Labs: Recent Labs    12/28/22 1133  HGB 12.3  HCT 37.8  PLT 241  CREATININE 0.87  TROPONINIHS 255*    Estimated Creatinine Clearance: 91.8 mL/min (by C-G formula based on SCr of 0.87 mg/dL).   Medical History: Past Medical History:  Diagnosis Date   Cervical dysplasia    Hx of dysplastic nevus ~2018   L med ankle, txted by Dr. Cheree Ditto   Hx of dysplastic nevus ~2018   L vulva, txted by Dr. Cheree Ditto   Hx of dysplastic nevus    L upper abdomen, txed by Dr. Cheree Ditto     Assessment: Patient is a 47 year old female diagnosed with ACS/STEMI. Pharmacy was consulted to initiate patient on a heparin infusion. Patient was not on anticoagulation prior to admission.   Baseline INR and aPTT ordered  No signs/symptoms of bleeding noted. Hgb 12.3. PLT 241.  Goal of Therapy:  Heparin level 0.3-0.7 units/ml Monitor platelets by anticoagulation protocol: Yes   Plan:  Give 4000 units bolus x 1 Start heparin infusion at 900 units/hr Check anti-Xa level in 6 hours and daily while on heparin Continue to monitor H&H and platelets  Merryl Hacker, PharmD Clinical Pharmacist 12/28/2022,1:25 PM

## 2022-12-28 NOTE — ED Provider Notes (Signed)
[facility]    CSN: [id number] Arrival date & time: 12/28/22  0941      History   Chief Complaint Chief Complaint  Patient presents with   Shortness of Breath   Diarrhea   elevated heart rate    HPI Jenna Mccarty is a 47 y.o. female.   HPI  Jenna Mccarty here for chest central tightness with "a little bit of an elephant sitting on my chest" type pain.  Walking up the hill her heart rate jumped up to the 130s and she feels more short of breath.  She has to take about 8-9 steps to get into her home and this is making her short of breath which is abnormal for her.  Cough.  Has been having some loose stools recently.  Notes that she has been stressed out as her husband wants to buy a new house and she is not on board with this.  Denies any history of PE or DVT but notes several months ago she had left calf pain that gradually resolved.  Around this time she had drove for about 6 hours.    Past Medical History:  Diagnosis Date   Cervical dysplasia    Hx of dysplastic nevus ~2018   L med ankle, txted by Dr. Cheree Mccarty   Hx of dysplastic nevus ~2018   L vulva, txted by Dr. Cheree Mccarty   Hx of dysplastic nevus    L upper abdomen, txed by Dr. Cheree Mccarty    Patient Active Problem List   Diagnosis Date Noted   Pulmonary emboli (HCC) 12/28/2022   Exposure to severe acute respiratory syndrome coronavirus 2 (SARS-CoV-2) 07/27/2019    Past Surgical History:  Procedure Laterality Date   COLPOSCOPY     KNEE SURGERY     TONSILLECTOMY     WISDOM TOOTH EXTRACTION      OB History     Gravida  0   Para  0   Term  0   Preterm  0   AB  0   Living  0      SAB  0   IAB  0   Ectopic  0   Multiple  0   Live Births  0            Home Medications    Prior to Admission medications   Medication Sig Start Date End Date Taking? Authorizing Provider  Multiple Vitamins-Minerals (MULTIVITAMIN PO) Take by mouth.   Yes [provider]   Norgestimate-Ethinyl Estradiol Triphasic 0.18/0.215/0.25 MG-35 MCG tablet Take by mouth. 04/04/17  Yes [provider]  omeprazole (PRILOSEC) 20 MG capsule Take by mouth.   Yes [provider]  cetirizine (ZYRTEC) 10 MG tablet Take by mouth.    [provider]  meloxicam (MOBIC) 15 MG tablet TAKE 1 TABLET(15 MG) BY MOUTH DAILY 05/25/22   Hyatt, Max T, DPM  norgestimate-ethinyl estradiol (ESTARYLLA) 0.25-35 MG-MCG tablet Take 1 tablet by mouth daily. 05/07/22   Copland, Ilona Sorrel, PA-C    Family History Family History  Problem Relation Age of Onset   Diabetes Mellitus II Mother    Other Mother        Pituitary Mass-benign   Breast cancer Paternal Aunt        pat great aunt   Diabetes Mellitus II Maternal Grandfather    Diabetes Mellitus II Paternal Grandmother    Cancer Other 52       Breast    Social History Social History  Tobacco Use   Smoking status: Never   Smokeless tobacco: Never  Vaping Use   Vaping status: Never Used  Substance Use Topics   Alcohol use: Yes    Comment: social   Drug use: No     Allergies   Penicillins   Review of Systems Review of Systems :negative unless otherwise stated in HPI.      Physical Exam Triage Vital Signs ED Triage Vitals  Encounter Vitals Group     BP 12/28/22 1040 (!) 133/96     Systolic BP Percentile --      Diastolic BP Percentile --      Pulse Rate 12/28/22 1040 (!) 109     Resp 12/28/22 1040 19     Temp 12/28/22 1040 98.4 F (36.9 C)     Temp Source 12/28/22 1040 Oral     SpO2 12/28/22 1040 99 %     Weight --      Height --      Head Circumference --      Peak Flow --      Pain Score 12/28/22 1037 0     Pain Loc --      Pain Education --      Exclude from Growth Chart --    No data found.  Updated Vital Signs BP (!) 133/96 (BP Location: Right Arm)   Pulse (!) 109   Temp 98.4 F (36.9 C) (Oral)   Resp 19   LMP 12/08/2022   SpO2 99%   Visual Acuity Right Eye Distance:    Left Eye Distance:   Bilateral Distance:    Right Eye Near:   Left Eye Near:    Bilateral Near:     Physical Exam  GEN: non-toxic appearing female, in no acute distress  CV: regular rhythm, tachycardic, no murmurs, rubs or gallops  appreciated, no JVP  CHEST WALL: non-tender, no deformity or step off  RESP: no increased work of breathing, clear to ascultation bilaterally SKIN: warm, dry, no rash on visible skin   UC Treatments / Results  Labs (all labs ordered are listed, but only abnormal results are displayed) Labs Reviewed - No data to display  EKG  See my EKG interpretation in the MDM section  Radiology    Procedures Procedures (including critical care time)  Medications Ordered in UC Medications - No data to display  Initial Impression / Assessment and Plan / UC Course  I have reviewed the triage vital signs and the nursing notes.  Pertinent labs & imaging results that were available during my care of the patient were reviewed by me and considered in my medical decision making (see chart for details).       Patient is a 47 y.o. female  who presents with dyspnea on exertion and chest pain with elevated hear rate. Differential diagnosis includes, but is not limited to, ACS, aortic dissection, pulmonary embolism, cardiac tamponade, pneumothorax, pneumonia, pericarditis/myocarditis, GI-related causes including esophagitis/gastritis, and musculoskeletal chest wall pain   Jenna Mccarty has no history of PE.  Unable to [facility] pt as she has remote history of left calf pain and is tachycardic here.  EKG showing sinus tachycardia with anterior changes. No EKG available for comparison.  Explained to patient that I am concerned that she may have a pulmonary embolism and needs to have cardiac chest workup in the emergency department. Urgent care workup truncated. Patient stable to drive to [facility] ED for further evaluation. Called and spoke with triage RN at [facility]  ED regarding concern for  possible PE who will await pt's arrival.    Discussed MDM, treatment plan and plan for follow-up with patient who agrees with plan.       Final Clinical Impressions(s) / UC Diagnoses   Final diagnoses:  Chest pain, unspecified type  SOB (shortness of breath)  Tachycardia     Discharge Instructions      You have been advised to follow up immediately in the emergency department for concerning signs or symptoms as discussed during your visit. If you declined EMS transport, please have a family member take you directly to the ED at this time. Do not delay.   Based on concerns about condition, if you do not follow up in the ED, you may risk poor outcomes including worsening of condition, delayed treatment and potentially life threatening issues. If you have declined to go to the ED at this time, you should call your PCP immediately to set up a follow up appointment.      ED Prescriptions   None    PDMP not reviewed this encounter.   Katha Cabal, DO 12/28/22 1542

## 2022-12-28 NOTE — H&P (Signed)
History and Physical    Patient: Jenna Mccarty VHQ:[id number] DOB: Nov 27, 1975 DOA: 12/28/2022 DOS: the patient was seen and examined on 12/28/2022 PCP: Pcp, No  Patient coming from: Home  Chief Complaint:  Chief Complaint  Patient presents with   Chest Pain   HPI: Jenna Mccarty is a 47 y.o. female with medical history significant of no significant prior medical history presenting with PE and NSTEMI.  Patient reporting roughly 4 to 5 days of persistent chest pain, shortness of breath with exertion and tachycardia.  Patient reports having significant shortness of breath especially with ambulation and walking up flights of stairs.  Mild left-sided chest pain.  No nausea or diaphoresis.  Positive intermittent diarrhea over similar timeframe.  No known prior history of DVT or PE in the past though patient does report remote history of left calf pain.  Patient reports having multiple extended flights within the past 1 to 2 months across the country.  Noted regular birth control use.  Does report history of blood clots and second degree relative.  No fevers or chills.  Chest pain and shortness of breath has progressively worsened.  Was seen in the urgent care today and redirected to the ER for further evaluation. Presented to the ER afebrile, heart rate 100s, BP stable.  Satting well on room air.  White count 10.9, hemoglobin 12.3, platelets 241, troponin 2 55-2 53.  Creatinine 0.87.  Chest x-ray within normal limits.  CTA of the chest with bilateral pulmonary artery clot burden as well as right heart strain.  Review of Systems: As mentioned in the history of present illness. All other systems reviewed and are negative. Past Medical History:  Diagnosis Date   Cervical dysplasia    Hx of dysplastic nevus ~2018   L med ankle, txted by Dr. Cheree Ditto   Hx of dysplastic nevus ~2018   L vulva, txted by Dr. Cheree Ditto   Hx of dysplastic nevus    L upper abdomen, txed by Dr. Cheree Ditto   Past  Surgical History:  Procedure Laterality Date   COLPOSCOPY     KNEE SURGERY     TONSILLECTOMY     WISDOM TOOTH EXTRACTION     Social History:  reports that she has never smoked. She has never used smokeless tobacco. She reports current alcohol use. She reports that she does not use drugs.  Allergies  Allergen Reactions   Penicillins Hives    Family History  Problem Relation Age of Onset   Diabetes Mellitus II Mother    Other Mother        Pituitary Mass-benign   Breast cancer Paternal Aunt        pat great aunt   Diabetes Mellitus II Maternal Grandfather    Diabetes Mellitus II Paternal Grandmother    Cancer Other 53       Breast    Prior to Admission medications   Medication Sig Start Date End Date Taking? Authorizing Provider  cetirizine (ZYRTEC) 10 MG tablet Take by mouth.   Yes [provider]  Multiple Vitamins-Minerals (MULTIVITAMIN PO) Take by mouth.   Yes [provider]  norgestimate-ethinyl estradiol (ESTARYLLA) 0.25-35 MG-MCG tablet Take 1 tablet by mouth daily. 05/07/22  Yes Copland, Ilona Sorrel, PA-C  omeprazole (PRILOSEC) 20 MG capsule Take by mouth.   Yes [provider]  meloxicam (MOBIC) 15 MG tablet TAKE 1 TABLET(15 MG) BY MOUTH DAILY Patient not taking: Reported on 12/28/2022 05/25/22   Elinor Parkinson, DPM  Norgestimate-Ethinyl  Estradiol Triphasic 0.18/0.215/0.25 MG-35 MCG tablet Take by mouth. 04/04/17   [provider]    Physical Exam: Vitals:   12/28/22 1131 12/28/22 1235 12/28/22 1510 12/28/22 1530  BP:  (!) 147/105 (!) 137/103 (!) 143/98  Pulse:  (!) 112 92 (!) 102  Resp:  (!) 22 16 (!) 22  Temp:      TempSrc:      SpO2:  97% 97% 97%  Weight: 90.7 kg     Height: 5\' 5"  (1.651 m)      Physical Exam Constitutional:      Appearance: She is obese.  HENT:     Head: Normocephalic and atraumatic.     Nose: Nose normal.     Mouth/Throat:     Mouth: Mucous membranes are moist.  Eyes:     Pupils: Pupils are equal,  round, and reactive to light.  Cardiovascular:     Rate and Rhythm: Normal rate and regular rhythm.  Pulmonary:     Effort: Pulmonary effort is normal.  Abdominal:     General: Bowel sounds are normal.  Musculoskeletal:        General: Normal range of motion.  Skin:    General: Skin is warm.  Neurological:     General: No focal deficit present.  Psychiatric:        Mood and Affect: Mood normal.     Data Reviewed:  There are no new results to review at this time.  DG Chest 2 View CLINICAL DATA:  Left-sided chest pain and shortness of breath.  EXAM: CHEST - 2 VIEW  COMPARISON:  None Available.  FINDINGS: The heart size and mediastinal contours are within normal limits. Both lungs are clear. The visualized skeletal structures are unremarkable.  IMPRESSION: No active cardiopulmonary disease.  Electronically Signed   By: Danae Orleans M.D.   On: 12/28/2022 14:52 CT Angio Chest PE W and/or Wo Contrast CLINICAL DATA:  Shortness of breath, elevated heart rate  EXAM: CT ANGIOGRAPHY CHEST WITH CONTRAST  TECHNIQUE: Multidetector CT imaging of the chest was performed using the standard protocol during bolus administration of intravenous contrast. Multiplanar CT image reconstructions and MIPs were obtained to evaluate the vascular anatomy.  RADIATION DOSE REDUCTION: This exam was performed according to the departmental dose-optimization program which includes automated exposure control, adjustment of the mA and/or kV according to patient size and/or use of iterative reconstruction technique.  CONTRAST:  75mL OMNIPAQUE IOHEXOL 350 MG/ML SOLN  COMPARISON:  None Available.  FINDINGS: Cardiovascular: Acute pulmonary embolus seen in the bilateral distal right and left pulmonary arteries extending into the lobar arteries and bilateral segmental and subsegmental branches. Elevated RV to LV ratio of 1.9.  Normal overall heart size. No pericardial effusion. No  coronary artery calcifications. Normal caliber thoracic aorta with no significant atherosclerotic disease.  Mediastinum/Nodes: Esophagus and thyroid are unremarkable. No enlarged lymph nodes seen in the chest.  Lungs/Pleura: central airways are patent. No consolidation, pleural effusion or pneumothorax.  Upper Abdomen: No acute abnormality.  Musculoskeletal: No chest wall abnormality. No acute or significant osseous findings.  Review of the MIP images confirms the above findings.  IMPRESSION: 1. Acute pulmonary embolus seen in the bilateral distal right and left pulmonary arteries extending into the lobar arteries and bilateral segmental and subsegmental branches. 2. Elevated RV to LV ratio of 1.9, consistent with right heart strain.  Provider Dr.PATRICK ROBINSON, called PRA line to request STAT read and is aware of positive result.  Electronically Signed  By: Allegra Lai M.D.   On: 12/28/2022 14:47  Lab Results  Component Value Date   WBC 10.9 (H) 12/28/2022   HGB 12.3 12/28/2022   HCT 37.8 12/28/2022   MCV 89.2 12/28/2022   PLT 241 12/28/2022   Last metabolic panel Lab Results  Component Value Date   GLUCOSE 122 (H) 12/28/2022   NA 137 12/28/2022   K 3.7 12/28/2022   CL 107 12/28/2022   CO2 21 (L) 12/28/2022   BUN 12 12/28/2022   CREATININE 0.87 12/28/2022   GFRNONAA >60 12/28/2022   CALCIUM 8.7 (L) 12/28/2022   PROT 7.0 05/07/2022   ALBUMIN 4.3 05/07/2022   LABGLOB 2.7 05/07/2022   AGRATIO 1.6 05/07/2022   BILITOT 0.3 05/07/2022   ALKPHOS 83 05/07/2022   AST 26 05/07/2022   ALT 27 05/07/2022   ANIONGAP 9 12/28/2022    Assessment and Plan: * Pulmonary emboli (HCC) Progressive shortness of breath and chest pain x 3 to 4 days Risk factors include recent multiple extended air flights as well as birth control use CT of the chest with PE and bilateral pulmonary arteries with right heart strain Started on heparin drip in the ER Vascular surgery  consulted with plan for thrombectomy Lower extremity ultrasound ordered   NSTEMI (non-ST elevated myocardial infarction) (HCC) Troponin 200s in setting of PE and noted right heart strain EKG sinus tach Suspect secondary demand ischemia Will trend hemoglobin for now On heparin in the setting of PE 2D echo Pending thrombectomy with vascular surgery Monitor   Greater than 50% was spent in counseling and coordination of care with patient Total encounter time 80 minutes or more     Advance Care Planning:   Code Status: Full Code   Consults: Vascular surgery   Family Communication: Family at the bedside   Severity of Illness: The appropriate patient status for this patient is INPATIENT. Inpatient status is judged to be reasonable and necessary in order to provide the required intensity of service to ensure the patient's safety. The patient's presenting symptoms, physical exam findings, and initial radiographic and laboratory data in the context of their chronic comorbidities is felt to place them at high risk for further clinical deterioration. Furthermore, it is not anticipated that the patient will be medically stable for discharge from the hospital within 2 midnights of admission.   * I certify that at the point of admission it is my clinical judgment that the patient will require inpatient hospital care spanning beyond 2 midnights from the point of admission due to high intensity of service, high risk for further deterioration and high frequency of surveillance required.*  Author: Floydene Flock, MD 12/28/2022 4:20 PM  For on call review [email or web address].

## 2022-12-28 NOTE — Consult Note (Signed)
Hospital Consult    Reason for Consult:  Bilateral Pulmonary Embolism with right heart strain.  Requesting Physician:  Dr Willy Eddy MD  MRN #:  [id number]  History of Present Illness: This is a 47 y.o. female who presents to [facility] emergency department today after having severe shortness of breath on Saturday 12/26/2022.  Patient endorses she went to a football game and while trying to walk uphill from the parking lot to the stadium she gets severely short of breath and had to stop.  Immediately after she had to walk up a set of stairs to her seat at the football game he got severely short of breath again and realized something was wrong.  Patient endorses her work requires her to sit most of the time.  Patient also endorses flying over the past 3 weeks multiple days a week.  Patient also endorses being on birth control.  Patient also endorses a car ride for 6 hours or more which she did not stop.  In discussion with the patient these are all things that could contribute to a pulmonary embolism.  She verbalizes her understanding.  On exam this afternoon she is resting comfortably in a stretcher in the emergency department she remains on room air at 97% while nonambulatory.  Nursing reports when she is ambulatory her oxygen saturation drops into the 80s.  Patient denies any recent cough cold flu or COVID-19.  Patient denies any pain to her lower extremities especially in her cast at rest or while walking.  Patient does endorse some sharp pains to her right chest area underneath her arm but other than that denies any standard chest pain.  She does endorse difficulty in taking deep breaths.  Vascular surgery was consulted to evaluate.  Past Medical History:  Diagnosis Date   Cervical dysplasia    Hx of dysplastic nevus ~2018   L med ankle, txted by Dr. Cheree Ditto   Hx of dysplastic nevus ~2018   L vulva, txted by Dr. Cheree Ditto   Hx of dysplastic nevus    L upper abdomen, txed by Dr. Cheree Ditto     Past Surgical History:  Procedure Laterality Date   COLPOSCOPY     KNEE SURGERY     TONSILLECTOMY     WISDOM TOOTH EXTRACTION      Allergies  Allergen Reactions   Penicillins Hives    Prior to Admission medications   Medication Sig Start Date End Date Taking? Authorizing Provider  cetirizine (ZYRTEC) 10 MG tablet Take by mouth.    [provider]  meloxicam (MOBIC) 15 MG tablet TAKE 1 TABLET(15 MG) BY MOUTH DAILY 05/25/22   Hyatt, Max T, DPM  Multiple Vitamins-Minerals (MULTIVITAMIN PO) Take by mouth.    [provider]  norgestimate-ethinyl estradiol (ESTARYLLA) 0.25-35 MG-MCG tablet Take 1 tablet by mouth daily. 05/07/22   Copland, Ilona Sorrel, PA-C  Norgestimate-Ethinyl Estradiol Triphasic 0.18/0.215/0.25 MG-35 MCG tablet Take by mouth. 04/04/17   [provider]  omeprazole (PRILOSEC) 20 MG capsule Take by mouth.    [provider]    Social History   Socioeconomic History   Marital status: Married    Spouse name: Not on file   Number of children: Not on file   Years of education: Not on file   Highest education level: Not on file  Occupational History   Not on file  Tobacco Use   Smoking status: Never   Smokeless tobacco: Never  Vaping Use   Vaping status: Never Used  Substance and Sexual Activity   Alcohol use: Yes    Comment: social   Drug use: No   Sexual activity: Yes    Birth control/protection: Pill  Other Topics Concern   Not on file  Social History Narrative   Not on file   Social Determinants of Health   Financial Resource Strain: Not on file  Food Insecurity: Not on file  Transportation Needs: Not on file  Physical Activity: Not on file  Stress: Not on file  Social Connections: Not on file  Intimate Partner Violence: Not on file     Family History  Problem Relation Age of Onset   Diabetes Mellitus II Mother    Other Mother        Pituitary Mass-benign   Breast cancer Paternal Aunt        pat great  aunt   Diabetes Mellitus II Maternal Grandfather    Diabetes Mellitus II Paternal Grandmother    Cancer Other 1       Breast    ROS: Otherwise negative unless mentioned in HPI  Physical Examination  Vitals:   12/28/22 1130 12/28/22 1235  BP: (!) 151/110 (!) 147/105  Pulse: (!) 118 (!) 112  Resp: 18 (!) 22  Temp: 98.8 F (37.1 C)   SpO2: 96% 97%   Body mass index is 33.28 kg/m.  General:  WDWN in NAD Gait: Not observed HENT: WNL, normocephalic Pulmonary: Labored breathing while ambulating, without Rales, rhonchi, but slight wheezing heard throughout all lung fields. Cardiac: regular, without  Murmurs, rubs or gallops; without carotid bruits Abdomen: Positive bowel sounds throughout, soft, NT/ND, no masses Skin: without rashes Vascular Exam/Pulses: Positive throughout Extremities: without ischemic changes, without Gangrene , without cellulitis; without open wounds;  Musculoskeletal: no muscle wasting or atrophy.  Positive obesity  Neurologic: A&O X 3;  No focal weakness or paresthesias are detected; speech is fluent/normal Psychiatric:  The pt has Normal affect. Lymph:  Unremarkable  CBC    Component Value Date/Time   WBC 10.9 (H) 12/28/2022 1133   RBC 4.24 12/28/2022 1133   HGB 12.3 12/28/2022 1133   HCT 37.8 12/28/2022 1133   PLT 241 12/28/2022 1133   MCV 89.2 12/28/2022 1133   MCH 29.0 12/28/2022 1133   MCHC 32.5 12/28/2022 1133   RDW 12.7 12/28/2022 1133    BMET    Component Value Date/Time   NA 137 12/28/2022 1133   NA 140 05/07/2022 0844   K 3.7 12/28/2022 1133   CL 107 12/28/2022 1133   CO2 21 (L) 12/28/2022 1133   GLUCOSE 122 (H) 12/28/2022 1133   BUN 12 12/28/2022 1133   BUN 17 05/07/2022 0844   CREATININE 0.87 12/28/2022 1133   CALCIUM 8.7 (L) 12/28/2022 1133   GFRNONAA >60 12/28/2022 1133   GFRAA 85 04/03/2019 0849    COAGS: Lab Results  Component Value Date   INR 1.0 12/28/2022     Non-Invasive Vascular Imaging:    EXAM:12/28/22 CT ANGIOGRAPHY CHEST WITH CONTRAST   TECHNIQUE: Multidetector CT imaging of the chest was performed using the standard protocol during bolus administration of intravenous contrast. Multiplanar CT image reconstructions and MIPs were obtained to evaluate the vascular anatomy.   RADIATION DOSE REDUCTION: This exam was performed according to the departmental dose-optimization program which includes automated exposure control, adjustment of the mA and/or kV according to patient size and/or use of iterative reconstruction technique.   CONTRAST:  75mL OMNIPAQUE IOHEXOL 350 MG/ML SOLN   COMPARISON:  None Available.  FINDINGS: Cardiovascular: Acute pulmonary embolus seen in the bilateral distal right and left pulmonary arteries extending into the lobar arteries and bilateral segmental and subsegmental branches. Elevated RV to LV ratio of 1.9.   Normal overall heart size. No pericardial effusion. No coronary artery calcifications. Normal caliber thoracic aorta with no significant atherosclerotic disease.   Mediastinum/Nodes: Esophagus and thyroid are unremarkable. No enlarged lymph nodes seen in the chest.   Lungs/Pleura: central airways are patent. No consolidation, pleural effusion or pneumothorax.   Upper Abdomen: No acute abnormality.   Musculoskeletal: No chest wall abnormality. No acute or significant osseous findings.   Review of the MIP images confirms the above findings.   IMPRESSION: 1. Acute pulmonary embolus seen in the bilateral distal right and left pulmonary arteries extending into the lobar arteries and bilateral segmental and subsegmental branches. 2. Elevated RV to LV ratio of 1.9, consistent with right heart strain.  Statin:  No. Beta Blocker:  No. Aspirin:  No. ACEI:  No. ARB:  No. CCB use:  No Other antiplatelets/anticoagulants:  No.  Patient taking Birth control.    ASSESSMENT/PLAN: This is a 47 y.o. female who presents to [facility]  emergency department after acute episode of shortness of breath on Saturday, 12/26/2022 while at a football game.  Upon workup patient underwent a CTA which showed bilateral pulmonary embolisms with right heart strain of 1.9.  Patient in no acute distress and remains on room air oxygen.  Heparin drip was started in the emergency department.  Vascular surgery recommends bilateral lower extremity ultrasounds to rule out DVTs.  Also recommend standard labs CBC CMP and anticoagulation labs.  Vascular surgery plans on taking the patient to the vascular lab tomorrow on 12/29/2022 for pulmonary thrombectomy with possible IVC filter placement.  I discussed in detail with the patient, her husband and her mother at the bedside the procedure, benefits, risk, and complications.  All verbalized her understanding and wished to proceed to soon as possible.  I answered all their questions this afternoon.  Patient was alerted she will be made n.p.o. after midnight tonight for procedure tomorrow.  Heparin infusion will continue overnight.   -I discussed the plan in detail with Dr. Festus Barren MD and he is in agreement with the plan.   [name] 12/28/2022 3:13 PM

## 2022-12-28 NOTE — ED Triage Notes (Signed)
Patient to ED via POV for left-sided CP. Sent from UC. Also having SOB. States she did have a soft BM this AM. Denies cardiac hx. Pt states she took 1 baby Asprin this AM PTA.

## 2022-12-28 NOTE — Telephone Encounter (Signed)
  Chief Complaint: Diarrhea Symptoms: Diarrhea, 5 times daily since Thursday. Anxiety, SOB with exertion at times. Frequency: Thursday Pertinent Negatives: Patient denies S/S dehydration Disposition: [] ED /[x] Urgent Care (no appt availability in office) / [] Appointment(In office/virtual)/ []  [place] [facility]/ [] Home Care/ [] Refused Recommended Disposition /[] [place] [facility]/ []  Follow-up with PCP Additional Notes: No PCP. Established care with Dr. Ashley Royalty. Advised UC for acute issue. States will follow disposition. Reason for Disposition  [1] MODERATE diarrhea (e.g., 4-6 times / day more than normal) AND [2] present > 48 hours (2 days)  Answer Assessment - Initial Assessment Questions 1. DIARRHEA SEVERITY: "How bad is the diarrhea?" "How many more stools have you had in the past 24 hours than normal?"    - NO DIARRHEA (SCALE 0)   - MILD (SCALE 1-3): Few loose or mushy BMs; increase of 1-3 stools over normal daily number of stools; mild increase in ostomy output.   -  MODERATE (SCALE 4-7): Increase of 4-6 stools daily over normal; moderate increase in ostomy output.   -  SEVERE (SCALE 8-10; OR "WORST POSSIBLE"): Increase of 7 or more stools daily over normal; moderate increase in ostomy output; incontinence.     5 times 2. ONSET: "When did the diarrhea begin?"      Thursday 3. BM CONSISTENCY: "How loose or watery is the diarrhea?"      Loose 4. VOMITING: "Are you also vomiting?" If Yes, ask: "How many times in the past 24 hours?"      No 5. ABDOMEN PAIN: "Are you having any abdomen pain?" If Yes, ask: "What does it feel like?" (e.g., crampy, dull, intermittent, constant)      no 6. ABDOMEN PAIN SEVERITY: If present, ask: "How bad is the pain?"  (e.g., Scale 1-10; mild, moderate, or severe)   - MILD (1-3): doesn't interfere with normal activities, abdomen soft and not tender to touch    - MODERATE (4-7): interferes with normal activities or awakens from sleep, abdomen tender  to touch    - SEVERE (8-10): excruciating pain, doubled over, unable to do any normal activities       NA 7. ORAL INTAKE: If vomiting, "Have you been able to drink liquids?" "How much liquids have you had in the past 24 hours?"     NA 8. HYDRATION: "Any signs of dehydration?" (e.g., dry mouth [not just dry lips], too weak to stand, dizziness, new weight loss) "When did you last urinate?" No 9. EXPOSURE: "Have you traveled to a foreign country recently?" "Have you been exposed to anyone with diarrhea?" "Could you have eaten any food that was spoiled?"     No 10. ANTIBIOTIC USE: "Are you taking antibiotics now or have you taken antibiotics in the past 2 months?"        11. OTHER SYMPTOMS: "Do you have any other symptoms?" (e.g., fever, blood in stool)       SOB with exertion, HR at 90-100. BP 113/90. Anxiety  Protocols used: [facility]

## 2022-12-28 NOTE — Assessment & Plan Note (Signed)
Positive pulmonary embolism in the setting of extended travel and oral contraceptive use Discussed changing of birth control options Hold OCP for now Follow-up outpatient

## 2022-12-28 NOTE — ED Triage Notes (Addendum)
Sx started sat.   Elevated heart rate, sob with elevation, diarrhea.  Patient feels that this is anxiety. Patient asked for an EKG.

## 2022-12-28 NOTE — ED Notes (Signed)
Patient is being discharged from the Urgent Care and sent to the Emergency Department via POV. Per Rachael Darby, MD, patient is in need of higher level of care due to chest pain and SOB. Patient is aware and verbalizes understanding of plan of care.  Vitals:   12/28/22 1040  BP: (!) 133/96  Pulse: (!) 109  Resp: 19  Temp: 98.4 F (36.9 C)  SpO2: 99%

## 2022-12-28 NOTE — Assessment & Plan Note (Signed)
Progressive shortness of breath and chest pain x 3 to 4 days Risk factors include recent multiple extended air flights as well as birth control use CT of the chest with PE and bilateral pulmonary arteries with right heart strain Started on heparin drip in the ER Vascular surgery consulted with plan for thrombectomy Lower extremity ultrasound ordered

## 2022-12-28 NOTE — Assessment & Plan Note (Deleted)
Troponin 200s in setting of PE and noted right heart strain EKG sinus tach Suspect secondary demand ischemia Will trend hemoglobin for now On heparin in the setting of PE 2D echo Pending thrombectomy with vascular surgery Monitor

## 2022-12-28 NOTE — Discharge Instructions (Addendum)

## 2022-12-28 NOTE — ED Notes (Signed)
MD Derrill Kay  Informed of trop of 255

## 2022-12-28 NOTE — Progress Notes (Signed)
PHARMACY - ANTICOAGULATION CONSULT NOTE  Pharmacy Consult for Heparin Infusion Indication: chest pain/ACS  Allergies  Allergen Reactions   Penicillins Hives    Patient Measurements: Height: 5\' 5"  (165.1 cm) Weight: 90.7 kg (200 lb) IBW/kg (Calculated) : 57 Heparin Dosing Weight: 77.1 kg  Vital Signs: Temp: 98.8 F (37.1 C) (11/11 1130) Temp Source: Oral (11/11 1130) BP: 116/84 (11/11 2200) Pulse Rate: 97 (11/11 2200)  Labs: Recent Labs    12/28/22 1133 12/28/22 1246 12/28/22 1326 12/28/22 2016  HGB 12.3  --   --   --   HCT 37.8  --   --   --   PLT 241  --   --   --   APTT  --  27  --   --   LABPROT  --  13.3  --   --   INR  --  1.0  --   --   HEPARINUNFRC  --   --   --  <0.10*  CREATININE 0.87  --   --   --   TROPONINIHS 255*  --  253* 145*    Estimated Creatinine Clearance: 91.8 mL/min (by C-G formula based on SCr of 0.87 mg/dL).   Medical History: Past Medical History:  Diagnosis Date   Cervical dysplasia    Hx of dysplastic nevus ~2018   L med ankle, txted by Dr. Cheree Ditto   Hx of dysplastic nevus ~2018   L vulva, txted by Dr. Cheree Ditto   Hx of dysplastic nevus    L upper abdomen, txed by Dr. Cheree Ditto     Assessment: Patient is a 47 year old female diagnosed with ACS/STEMI. Pharmacy was consulted to initiate patient on a heparin infusion. Patient was not on anticoagulation prior to admission.   Baseline INR and aPTT ordered  No signs/symptoms of bleeding noted. Hgb 12.3. PLT 241.  Goal of Therapy:  Heparin level 0.3-0.7 units/ml Monitor platelets by anticoagulation protocol: Yes  11/11 2016 HL < 0.1   Plan:  Bolus 2300 units x 1 Increase heparin infusion to 1150 units/hr Recheck HL w/ AM labs after rate change CBC daily while on heparin  Otelia Sergeant, PharmD, [facility] 12/28/2022 10:58 PM

## 2022-12-28 NOTE — H&P (View-Only) (Signed)
Hospital Consult    Reason for Consult:  Bilateral Pulmonary Embolism with right heart strain.  Requesting Physician:  Dr Willy Eddy MD  MRN #:  [id number]  History of Present Illness: This is a 47 y.o. female who presents to [facility] emergency department today after having severe shortness of breath on Saturday 12/26/2022.  Patient endorses she went to a football game and while trying to walk uphill from the parking lot to the stadium she gets severely short of breath and had to stop.  Immediately after she had to walk up a set of stairs to her seat at the football game he got severely short of breath again and realized something was wrong.  Patient endorses her work requires her to sit most of the time.  Patient also endorses flying over the past 3 weeks multiple days a week.  Patient also endorses being on birth control.  Patient also endorses a car ride for 6 hours or more which she did not stop.  In discussion with the patient these are all things that could contribute to a pulmonary embolism.  She verbalizes her understanding.  On exam this afternoon she is resting comfortably in a stretcher in the emergency department she remains on room air at 97% while nonambulatory.  Nursing reports when she is ambulatory her oxygen saturation drops into the 80s.  Patient denies any recent cough cold flu or COVID-19.  Patient denies any pain to her lower extremities especially in her cast at rest or while walking.  Patient does endorse some sharp pains to her right chest area underneath her arm but other than that denies any standard chest pain.  She does endorse difficulty in taking deep breaths.  Vascular surgery was consulted to evaluate.  Past Medical History:  Diagnosis Date   Cervical dysplasia    Hx of dysplastic nevus ~2018   L med ankle, txted by Dr. Cheree Ditto   Hx of dysplastic nevus ~2018   L vulva, txted by Dr. Cheree Ditto   Hx of dysplastic nevus    L upper abdomen, txed by Dr. Cheree Ditto     Past Surgical History:  Procedure Laterality Date   COLPOSCOPY     KNEE SURGERY     TONSILLECTOMY     WISDOM TOOTH EXTRACTION      Allergies  Allergen Reactions   Penicillins Hives    Prior to Admission medications   Medication Sig Start Date End Date Taking? Authorizing Provider  cetirizine (ZYRTEC) 10 MG tablet Take by mouth.    [provider]  meloxicam (MOBIC) 15 MG tablet TAKE 1 TABLET(15 MG) BY MOUTH DAILY 05/25/22   Hyatt, Max T, DPM  Multiple Vitamins-Minerals (MULTIVITAMIN PO) Take by mouth.    [provider]  norgestimate-ethinyl estradiol (ESTARYLLA) 0.25-35 MG-MCG tablet Take 1 tablet by mouth daily. 05/07/22   Copland, Ilona Sorrel, PA-C  Norgestimate-Ethinyl Estradiol Triphasic 0.18/0.215/0.25 MG-35 MCG tablet Take by mouth. 04/04/17   [provider]  omeprazole (PRILOSEC) 20 MG capsule Take by mouth.    [provider]    Social History   Socioeconomic History   Marital status: Married    Spouse name: Not on file   Number of children: Not on file   Years of education: Not on file   Highest education level: Not on file  Occupational History   Not on file  Tobacco Use   Smoking status: Never   Smokeless tobacco: Never  Vaping Use   Vaping status: Never Used  Substance and Sexual Activity   Alcohol use: Yes    Comment: social   Drug use: No   Sexual activity: Yes    Birth control/protection: Pill  Other Topics Concern   Not on file  Social History Narrative   Not on file   Social Determinants of Health   Financial Resource Strain: Not on file  Food Insecurity: Not on file  Transportation Needs: Not on file  Physical Activity: Not on file  Stress: Not on file  Social Connections: Not on file  Intimate Partner Violence: Not on file     Family History  Problem Relation Age of Onset   Diabetes Mellitus II Mother    Other Mother        Pituitary Mass-benign   Breast cancer Paternal Aunt        pat great  aunt   Diabetes Mellitus II Maternal Grandfather    Diabetes Mellitus II Paternal Grandmother    Cancer Other 1       Breast    ROS: Otherwise negative unless mentioned in HPI  Physical Examination  Vitals:   12/28/22 1130 12/28/22 1235  BP: (!) 151/110 (!) 147/105  Pulse: (!) 118 (!) 112  Resp: 18 (!) 22  Temp: 98.8 F (37.1 C)   SpO2: 96% 97%   Body mass index is 33.28 kg/m.  General:  WDWN in NAD Gait: Not observed HENT: WNL, normocephalic Pulmonary: Labored breathing while ambulating, without Rales, rhonchi, but slight wheezing heard throughout all lung fields. Cardiac: regular, without  Murmurs, rubs or gallops; without carotid bruits Abdomen: Positive bowel sounds throughout, soft, NT/ND, no masses Skin: without rashes Vascular Exam/Pulses: Positive throughout Extremities: without ischemic changes, without Gangrene , without cellulitis; without open wounds;  Musculoskeletal: no muscle wasting or atrophy.  Positive obesity  Neurologic: A&O X 3;  No focal weakness or paresthesias are detected; speech is fluent/normal Psychiatric:  The pt has Normal affect. Lymph:  Unremarkable  CBC    Component Value Date/Time   WBC 10.9 (H) 12/28/2022 1133   RBC 4.24 12/28/2022 1133   HGB 12.3 12/28/2022 1133   HCT 37.8 12/28/2022 1133   PLT 241 12/28/2022 1133   MCV 89.2 12/28/2022 1133   MCH 29.0 12/28/2022 1133   MCHC 32.5 12/28/2022 1133   RDW 12.7 12/28/2022 1133    BMET    Component Value Date/Time   NA 137 12/28/2022 1133   NA 140 05/07/2022 0844   K 3.7 12/28/2022 1133   CL 107 12/28/2022 1133   CO2 21 (L) 12/28/2022 1133   GLUCOSE 122 (H) 12/28/2022 1133   BUN 12 12/28/2022 1133   BUN 17 05/07/2022 0844   CREATININE 0.87 12/28/2022 1133   CALCIUM 8.7 (L) 12/28/2022 1133   GFRNONAA >60 12/28/2022 1133   GFRAA 85 04/03/2019 0849    COAGS: Lab Results  Component Value Date   INR 1.0 12/28/2022     Non-Invasive Vascular Imaging:    EXAM:12/28/22 CT ANGIOGRAPHY CHEST WITH CONTRAST   TECHNIQUE: Multidetector CT imaging of the chest was performed using the standard protocol during bolus administration of intravenous contrast. Multiplanar CT image reconstructions and MIPs were obtained to evaluate the vascular anatomy.   RADIATION DOSE REDUCTION: This exam was performed according to the departmental dose-optimization program which includes automated exposure control, adjustment of the mA and/or kV according to patient size and/or use of iterative reconstruction technique.   CONTRAST:  75mL OMNIPAQUE IOHEXOL 350 MG/ML SOLN   COMPARISON:  None Available.  FINDINGS: Cardiovascular: Acute pulmonary embolus seen in the bilateral distal right and left pulmonary arteries extending into the lobar arteries and bilateral segmental and subsegmental branches. Elevated RV to LV ratio of 1.9.   Normal overall heart size. No pericardial effusion. No coronary artery calcifications. Normal caliber thoracic aorta with no significant atherosclerotic disease.   Mediastinum/Nodes: Esophagus and thyroid are unremarkable. No enlarged lymph nodes seen in the chest.   Lungs/Pleura: central airways are patent. No consolidation, pleural effusion or pneumothorax.   Upper Abdomen: No acute abnormality.   Musculoskeletal: No chest wall abnormality. No acute or significant osseous findings.   Review of the MIP images confirms the above findings.   IMPRESSION: 1. Acute pulmonary embolus seen in the bilateral distal right and left pulmonary arteries extending into the lobar arteries and bilateral segmental and subsegmental branches. 2. Elevated RV to LV ratio of 1.9, consistent with right heart strain.  Statin:  No. Beta Blocker:  No. Aspirin:  No. ACEI:  No. ARB:  No. CCB use:  No Other antiplatelets/anticoagulants:  No.  Patient taking Birth control.    ASSESSMENT/PLAN: This is a 47 y.o. female who presents to [facility]  emergency department after acute episode of shortness of breath on Saturday, 12/26/2022 while at a football game.  Upon workup patient underwent a CTA which showed bilateral pulmonary embolisms with right heart strain of 1.9.  Patient in no acute distress and remains on room air oxygen.  Heparin drip was started in the emergency department.  Vascular surgery recommends bilateral lower extremity ultrasounds to rule out DVTs.  Also recommend standard labs CBC CMP and anticoagulation labs.  Vascular surgery plans on taking the patient to the vascular lab tomorrow on 12/29/2022 for pulmonary thrombectomy with possible IVC filter placement.  I discussed in detail with the patient, her husband and her mother at the bedside the procedure, benefits, risk, and complications.  All verbalized her understanding and wished to proceed to soon as possible.  I answered all their questions this afternoon.  Patient was alerted she will be made n.p.o. after midnight tonight for procedure tomorrow.  Heparin infusion will continue overnight.   -I discussed the plan in detail with Dr. Festus Barren MD and he is in agreement with the plan.   [name] 12/28/2022 3:13 PM

## 2022-12-28 NOTE — ED Provider Notes (Signed)
w  [facility] Provider Note    Event Date/Time   First MD Initiated Contact with Patient 12/28/22 1238     (approximate)   History   Chest Pain   HPI  Jenna Mccarty is a 47 y.o. female on birth control presents to the ER for evaluation of midsternal nonradiating chest pain and shortness of breath with palpitations over the past few days.  No lower extremity swelling.  No history of DVT or PE.  Does have family history of CAD.  Describes it as a dull achy pressure in her chest.  No diaphoresis.  No fevers.  No wheezing.  Does not smoke.     Physical Exam   Triage Vital Signs: ED Triage Vitals  Encounter Vitals Group     BP 12/28/22 1130 (!) 151/110     Systolic BP Percentile --      Diastolic BP Percentile --      Pulse Rate 12/28/22 1130 (!) 118     Resp 12/28/22 1130 18     Temp 12/28/22 1130 98.8 F (37.1 C)     Temp Source 12/28/22 1130 Oral     SpO2 12/28/22 1130 96 %     Weight 12/28/22 1131 200 lb (90.7 kg)     Height 12/28/22 1131 5\' 5"  (1.651 m)     Head Circumference --      Peak Flow --      Pain Score 12/28/22 1131 1     Pain Loc --      Pain Education --      Exclude from Growth Chart --     Most recent vital signs: Vitals:   12/28/22 1130 12/28/22 1235  BP: (!) 151/110 (!) 147/105  Pulse: (!) 118 (!) 112  Resp: 18 (!) 22  Temp: 98.8 F (37.1 C)   SpO2: 96% 97%     Constitutional: Alert  Eyes: Conjunctivae are normal.  Head: Atraumatic. Nose: No congestion/rhinnorhea. Mouth/Throat: Mucous membranes are moist.   Neck: Painless ROM.  Cardiovascular:   Good peripheral circulation.  Tachycardic but well-perfused. Respiratory: Normal respiratory effort.  No retractions.  Gastrointestinal: Soft and nontender.  Musculoskeletal:  no deformity Neurologic:  MAE spontaneously. No gross focal neurologic deficits are appreciated.  Skin:  Skin is warm, dry and intact. No rash noted. Psychiatric: Mood and affect are  normal. Speech and behavior are normal.    ED Results / Procedures / Treatments   Labs (all labs ordered are listed, but only abnormal results are displayed) Labs Reviewed  BASIC METABOLIC PANEL - Abnormal; Notable for the following components:      Result Value   CO2 21 (*)    Glucose, Bld 122 (*)    Calcium 8.7 (*)    All other components within normal limits  CBC - Abnormal; Notable for the following components:   WBC 10.9 (*)    All other components within normal limits  TROPONIN I (HIGH SENSITIVITY) - Abnormal; Notable for the following components:   Troponin I (High Sensitivity) 255 (*)    All other components within normal limits  TROPONIN I (HIGH SENSITIVITY) - Abnormal; Notable for the following components:   Troponin I (High Sensitivity) 253 (*)    All other components within normal limits  HCG, QUANTITATIVE, PREGNANCY  PROTIME-INR  APTT  HEPARIN LEVEL (UNFRACTIONATED)     EKG  ED ECG REPORT I, Willy Eddy, the attending physician, personally viewed and interpreted this ECG.   Date: 12/28/2022  EKG Time: 11:33  Rate: 115  Rhythm: sinus  Axis: normal  Intervals: normal  ST&T Change:  no stemi, stemi    RADIOLOGY Please see ED Course for my review and interpretation.  I personally reviewed all radiographic images ordered to evaluate for the above acute complaints and reviewed radiology reports and findings.  These findings were personally discussed with the patient.  Please see medical record for radiology report.    PROCEDURES:  Critical Care performed: Yes, see critical care procedure note(s)  .Critical Care  Performed by: Willy Eddy, MD Authorized by: Willy Eddy, MD   Critical care provider statement:    Critical care time (minutes):  40   Critical care was necessary to treat or prevent imminent or life-threatening deterioration of the following conditions:  Circulatory failure   Critical care was time spent personally by  me on the following activities:  Ordering and performing treatments and interventions, ordering and review of laboratory studies, ordering and review of radiographic studies, pulse oximetry, re-evaluation of patient's condition, review of old charts, obtaining history from patient or surrogate, examination of patient, evaluation of patient's response to treatment, discussions with primary provider, discussions with consultants and development of treatment plan with patient or surrogate    MEDICATIONS ORDERED IN ED: Medications  heparin ADULT infusion 100 units/mL (25000 units/279mL) (900 Units/hr Intravenous New Bag/Given 12/28/22 1349)  heparin bolus via infusion 4,000 Units (4,000 Units Intravenous Bolus from Bag 12/28/22 1349)  iohexol (OMNIPAQUE) 350 MG/ML injection 75 mL (75 mLs Intravenous Contrast Given 12/28/22 1405)     IMPRESSION / MDM / ASSESSMENT AND PLAN / ED COURSE  I reviewed the triage vital signs and the nursing notes.                              Differential diagnosis includes, but is not limited to, Asthma, copd, CHF, pna, ptx, malignancy, Pe, anemia   Patient presenting to the ER for evaluation of symptoms as described above.  Based on symptoms, risk factors and considered above differential, this presenting complaint could reflect a potentially life-threatening illness therefore the patient will be placed on continuous pulse oximetry and telemetry for monitoring.  Laboratory evaluation will be sent to evaluate for the above complaints.  Patient's troponin is elevated.  Given her presentation I am concerned for PE will order CTA.  Will order heparin.    Clinical Course as of 12/28/22 1456  Mon Dec 28, 2022  1429 CTA my review and interpretation with evidence of bilateral PEs. [PR]  1455 Reassessed.  Remains nontoxic-appearing she does appear stable and appropriate for admission to the hospital.  I will consult hospitalist [PR]    Clinical Course User Index [PR]  Willy Eddy, MD     FINAL CLINICAL IMPRESSION(S) / ED DIAGNOSES   Final diagnoses:  Acute pulmonary embolism, unspecified pulmonary embolism type, unspecified whether acute cor pulmonale present [facility])     Rx / DC Orders   ED Discharge Orders     None        Note:  This document was prepared using Dragon voice recognition software and may include unintentional dictation errors.    Willy Eddy, MD 12/28/22 [phone]

## 2022-12-29 ENCOUNTER — Other Ambulatory Visit (HOSPITAL_COMMUNITY): Payer: Self-pay

## 2022-12-29 ENCOUNTER — Encounter: Payer: Self-pay | Admitting: Family Medicine

## 2022-12-29 ENCOUNTER — Encounter
Admission: EM | Disposition: A | Discharge status: Home / Self Care | Payer: Self-pay | Source: Home / Self Care | Attending: Internal Medicine

## 2022-12-29 DIAGNOSIS — I2782 Chronic pulmonary embolism: Secondary | ICD-10-CM | POA: Diagnosis not present

## 2022-12-29 DIAGNOSIS — Z309 Encounter for contraceptive management, unspecified: Secondary | ICD-10-CM | POA: Diagnosis not present

## 2022-12-29 DIAGNOSIS — E669 Obesity, unspecified: Secondary | ICD-10-CM | POA: Insufficient documentation

## 2022-12-29 DIAGNOSIS — I5A Non-ischemic myocardial injury (non-traumatic): Secondary | ICD-10-CM | POA: Diagnosis not present

## 2022-12-29 DIAGNOSIS — I82412 Acute embolism and thrombosis of left femoral vein: Secondary | ICD-10-CM | POA: Diagnosis not present

## 2022-12-29 DIAGNOSIS — I2699 Other pulmonary embolism without acute cor pulmonale: Secondary | ICD-10-CM | POA: Diagnosis not present

## 2022-12-29 HISTORY — PX: PULMONARY THROMBECTOMY: CATH118295

## 2022-12-29 LAB — CBC
HCT: 33.8 % — ABNORMAL LOW (ref 36.0–46.0)
Hemoglobin: 11.5 g/dL — ABNORMAL LOW (ref 12.0–15.0)
MCH: 29.9 pg (ref 26.0–34.0)
MCHC: 34 g/dL (ref 30.0–36.0)
MCV: 87.8 fL (ref 80.0–100.0)
Platelets: 212 10*3/uL (ref 150–400)
RBC: 3.85 MIL/uL — ABNORMAL LOW (ref 3.87–5.11)
RDW: 12.8 % (ref 11.5–15.5)
WBC: 8.2 10*3/uL (ref 4.0–10.5)
nRBC: 0 % (ref 0.0–0.2)

## 2022-12-29 LAB — COMPREHENSIVE METABOLIC PANEL
ALT: 24 U/L (ref 0–44)
AST: 22 U/L (ref 15–41)
Albumin: 3.3 g/dL — ABNORMAL LOW (ref 3.5–5.0)
Alkaline Phosphatase: 66 U/L (ref 38–126)
Anion gap: 10 (ref 5–15)
BUN: 15 mg/dL (ref 6–20)
CO2: 19 mmol/L — ABNORMAL LOW (ref 22–32)
Calcium: 8.5 mg/dL — ABNORMAL LOW (ref 8.9–10.3)
Chloride: 107 mmol/L (ref 98–111)
Creatinine, Ser: 0.78 mg/dL (ref 0.44–1.00)
GFR, Estimated: >60 mL/min (ref >60)
Glucose, Bld: 113 mg/dL — ABNORMAL HIGH (ref 70–99)
Potassium: 3.9 mmol/L (ref 3.5–5.1)
Sodium: 136 mmol/L (ref 135–145)
Total Bilirubin: 0.4 mg/dL (ref <1.2)
Total Protein: 6.2 g/dL — ABNORMAL LOW (ref 6.5–8.1)

## 2022-12-29 LAB — HEPARIN LEVEL (UNFRACTIONATED)
Heparin Unfractionated: 0.25 [IU]/mL — ABNORMAL LOW (ref 0.30–0.70)
Heparin Unfractionated: 0.21 [IU]/mL — ABNORMAL LOW (ref 0.30–0.70)
Heparin Unfractionated: 0.28 [IU]/mL — ABNORMAL LOW (ref 0.30–0.70)

## 2022-12-29 LAB — HIV ANTIBODY (ROUTINE TESTING W REFLEX): HIV Screen 4th Generation wRfx: NONREACTIVE

## 2022-12-29 SURGERY — PULMONARY THROMBECTOMY
Anesthesia: Moderate Sedation

## 2022-12-29 MED ORDER — FENTANYL CITRATE (PF) 100 MCG/2ML IJ SOLN
INTRAMUSCULAR | Status: DC | PRN
  Administered 2022-12-29: 25 ug via INTRAVENOUS
  Administered 2022-12-29: 50 ug via INTRAVENOUS

## 2022-12-29 MED ORDER — MIDAZOLAM HCL 5 MG/5ML IJ SOLN
INTRAMUSCULAR | Status: AC
Start: 2022-12-29 — End: ?
  Filled 2022-12-29: qty 5

## 2022-12-29 MED ORDER — VANCOMYCIN HCL IN DEXTROSE 1-5 GM/200ML-% IV SOLN
1000.0000 mg | INTRAVENOUS | Status: AC
Start: 2022-12-29 — End: 2022-12-29
  Administered 2022-12-29: 1000 mg via INTRAVENOUS
  Filled 2022-12-29: qty 200

## 2022-12-29 MED ORDER — HEPARIN BOLUS VIA INFUSION
1200.0000 [IU] | Freq: Once | INTRAVENOUS | Status: AC
  Administered 2022-12-29: 1200 [IU] via INTRAVENOUS
  Filled 2022-12-29: qty 1200

## 2022-12-29 MED ORDER — MIDAZOLAM HCL 2 MG/2ML IJ SOLN
INTRAMUSCULAR | Status: DC | PRN
  Administered 2022-12-29: 1 mg via INTRAVENOUS
  Administered 2022-12-29: 2 mg via INTRAVENOUS

## 2022-12-29 MED ORDER — HEPARIN SODIUM (PORCINE) 1000 UNIT/ML IJ SOLN
INTRAMUSCULAR | Status: DC | PRN
  Administered 2022-12-29: 4000 [IU] via INTRAVENOUS

## 2022-12-29 MED ORDER — SODIUM CHLORIDE 0.9 % IV SOLN: INTRAVENOUS | Status: DC

## 2022-12-29 MED ORDER — METHYLPREDNISOLONE SODIUM SUCC 125 MG IJ SOLR: 125.0000 mg | Freq: Once | INTRAMUSCULAR | Status: DC | PRN

## 2022-12-29 MED ORDER — ALTEPLASE 1 MG/ML SYRINGE FOR VASCULAR PROCEDURE
INTRAMUSCULAR | Status: DC | PRN
  Administered 2022-12-29 (×2): 4 mg via INTRA_ARTERIAL

## 2022-12-29 MED ORDER — HEPARIN (PORCINE) IN NACL 2000-0.9 UNIT/L-% IV SOLN
INTRAVENOUS | Status: DC | PRN
  Administered 2022-12-29: 1000 mL

## 2022-12-29 MED ORDER — FAMOTIDINE 20 MG PO TABS: 40.0000 mg | ORAL_TABLET | Freq: Once | ORAL | Status: DC | PRN

## 2022-12-29 MED ORDER — ALTEPLASE 2 MG IJ SOLR
INTRAMUSCULAR | Status: AC
Start: 2022-12-29 — End: ?
  Filled 2022-12-29: qty 8

## 2022-12-29 MED ORDER — VANCOMYCIN HCL IN DEXTROSE 1-5 GM/200ML-% IV SOLN
INTRAVENOUS | Status: AC
Start: 2022-12-29 — End: ?
  Filled 2022-12-29: qty 200

## 2022-12-29 MED ORDER — MIDAZOLAM HCL 2 MG/ML PO SYRP
8.0000 mg | ORAL_SOLUTION | Freq: Once | ORAL | Status: DC | PRN
  Filled 2022-12-29: qty 5

## 2022-12-29 MED ORDER — HEPARIN SODIUM (PORCINE) 1000 UNIT/ML IJ SOLN
INTRAMUSCULAR | Status: AC
  Filled 2022-12-29: qty 10

## 2022-12-29 MED ORDER — LIDOCAINE-EPINEPHRINE (PF) 1 %-1:200000 IJ SOLN
INTRAMUSCULAR | Status: DC | PRN
  Administered 2022-12-29: 10 mL

## 2022-12-29 MED ORDER — FENTANYL CITRATE (PF) 100 MCG/2ML IJ SOLN
INTRAMUSCULAR | Status: AC
  Filled 2022-12-29: qty 2

## 2022-12-29 MED ORDER — IODIXANOL 320 MG/ML IV SOLN
INTRAVENOUS | Status: DC | PRN
  Administered 2022-12-29: 55 mL

## 2022-12-29 MED ORDER — DIPHENHYDRAMINE HCL 50 MG/ML IJ SOLN: 50.0000 mg | Freq: Once | INTRAMUSCULAR | Status: DC | PRN

## 2022-12-29 MED ORDER — FENTANYL CITRATE PF 50 MCG/ML IJ SOSY: 12.5000 ug | PREFILLED_SYRINGE | Freq: Once | INTRAMUSCULAR | Status: DC | PRN

## 2022-12-29 SURGICAL SUPPLY — 16 items
CANISTER PENUMBRA ENGINE (MISCELLANEOUS) IMPLANT
CATH ANGIO 5F PIGTAIL 100CM (CATHETERS) IMPLANT
CATH INDIGO 12XTORQ 100 (CATHETERS) IMPLANT
CATH INDIGO SEP 12 (CATHETERS) IMPLANT
CATH SELECT BERN TIP 5F 130 (CATHETERS) IMPLANT
CLOSURE PERCLOSE PROSTYLE (VASCULAR PRODUCTS) IMPLANT
COVER PROBE ULTRASOUND 5X96 (MISCELLANEOUS) IMPLANT
GLIDEWIRE ADV .035X180CM (WIRE) IMPLANT
PACK ANGIOGRAPHY (CUSTOM PROCEDURE TRAY) ×1 IMPLANT
SHEATH BRITE TIP 6FRX11 (SHEATH) IMPLANT
SHEATH CHCK-FLO 14FR 13 (SHEATH) IMPLANT
SUT MNCRL AB 4-0 PS2 18 (SUTURE) IMPLANT
SYR MEDRAD MARK 7 150ML (SYRINGE) IMPLANT
TUBING CONTRAST HIGH PRESS 72 (TUBING) IMPLANT
WIRE GUIDERIGHT .035X150 (WIRE) IMPLANT
WIRE SUPRACORE 300CM (WIRE) IMPLANT

## 2022-12-29 NOTE — Progress Notes (Signed)
PHARMACY - ANTICOAGULATION CONSULT NOTE  Pharmacy Consult for Heparin Infusion Indication: chest pain/ACS  Allergies  Allergen Reactions   Penicillins Hives    Patient Measurements: Height: 5\' 5"  (165.1 cm) Weight: 90.7 kg (200 lb) IBW/kg (Calculated) : 57 Heparin Dosing Weight: 77.1 kg  Vital Signs: Temp: 98.7 F (37.1 C) (11/12 0647) Temp Source: Oral (11/12 0647) BP: 100/63 (11/12 0200) Pulse Rate: 92 (11/12 0200)  Labs: Recent Labs    12/28/22 1133 12/28/22 1246 12/28/22 1326 12/28/22 2016 12/29/22 0551  HGB 12.3  --   --   --  11.5*  HCT 37.8  --   --   --  33.8*  PLT 241  --   --   --  212  APTT  --  27  --   --   --   LABPROT  --  13.3  --   --   --   INR  --  1.0  --   --   --   HEPARINUNFRC  --   --   --  <0.10* 0.25*  CREATININE 0.87  --   --   --  0.78  TROPONINIHS 255*  --  253* 145*  --     Estimated Creatinine Clearance: 99.9 mL/min (by C-G formula based on SCr of 0.78 mg/dL).   Medical History: Past Medical History:  Diagnosis Date   Cervical dysplasia    Hx of dysplastic nevus ~2018   L med ankle, txted by Dr. Cheree Ditto   Hx of dysplastic nevus ~2018   L vulva, txted by Dr. Cheree Ditto   Hx of dysplastic nevus    L upper abdomen, txed by Dr. Cheree Ditto     Assessment: Patient is a 47 year old female diagnosed with ACS/STEMI. Pharmacy was consulted to initiate patient on a heparin infusion. Patient was not on anticoagulation prior to admission.   Baseline INR and aPTT ordered  No signs/symptoms of bleeding noted. Hgb 12.3. PLT 241.  Goal of Therapy:  Heparin level 0.3-0.7 units/ml Monitor platelets by anticoagulation protocol: Yes  11/11 2016 HL < 0.1, subtherapeutic 11/12 0551 HL 0.25, subtherapeutic   Plan:  Bolus 1200 units x 1 Increase heparin infusion to 1300 units/hr Recheck HL in 6 hrs after rate change CBC daily while on heparin  Otelia Sergeant, PharmD, [facility] 12/29/2022 6:47 AM

## 2022-12-29 NOTE — Op Note (Signed)
[facility]  Percutaneous Study/Intervention Procedural Note   Date of Surgery: 12/29/2022,5:33 PM  Surgeon: Festus Barren  Pre-operative Diagnosis: Symptomatic bilateral pulmonary emboli  Post-operative diagnosis:  Same  Procedure(s) Performed:  1.  Contrast injection right heart  2.  Thrombolysis with 8 mg of tPA, 4 mg in each of the main pulmonary arteries  3.  Mechanical thrombectomy using the penumbra CAT 12 catheter to the left lower lobe and left upper lobe pulmonary arteries in the right lower lobe, middle lobe, and upper lobe pulmonary arteries  4.  Selective catheter placement right lower lobe, middle lobe, and upper lobe pulmonary arteries  5.  Selective catheter placement left lower lobe and upper lobe pulmonary arteries    Anesthesia: Conscious sedation was administered under my direct supervision by the interventional radiology RN. IV Versed plus fentanyl were utilized. Continuous ECG, pulse oximetry and blood pressure was monitored throughout the entire procedure.  Versed and fentanyl were administered intravenously.  Conscious sedation was administered for a total of 41 minutes using 3 mg of Versed and 75 mcg of Fentanyl.  EBL: 350 cc  Sheath: 14 French right femoral vein  Contrast: 55 cc   Fluoroscopy Time: 14.9 minutes  Indications:  Patient presents with pulmonary emboli. The patient is symptomatic with hypoxemia and dyspnea on exertion.  Her troponin is markedly elevated.  There is evidence of right heart strain on the CT angiogram. The patient is otherwise a good candidate for intervention and even the long-term benefits pulmonary angiography with thrombolysis is offered. The risks and benefits are reviewed long-term benefits are discussed. All questions are answered patient agrees to proceed.  Procedure:  Jenna Mccarty a 47 y.o. female who was identified and appropriate procedural time out was performed.  The patient was then  placed supine on the table and prepped and draped in the usual sterile fashion.  Ultrasound was used to evaluate the right common femoral vein.  It was patent, as it was echolucent and compressible.  A digital ultrasound image was acquired for the permanent record.  A Seldinger needle was used to access the right common femoral vein under direct ultrasound guidance.  A 0.035 J wire was advanced without resistance and a 6Fr sheath was placed. A proglide device was placed in a preclose fashion and then upsized to a 14 [place] sheath.    The Advantage wire and pigtail catheter were then negotiated into the right atrium and bolus injection of contrast was utilized to demonstrate the right ventricle and the pulmonary artery outflow. The Advantage wire and catheter were then negotiated into the the pulmonary arteries.  The left pulmonary artery was cannulated and advanced into the left upper lobe and left lower lobe for selective imaging. This demonstrated significant thrombus burden in both left upper and left lower lobe with more in the left lower lobe.  I then transitioned to the right side with the advantage wire and catheter and first cannulated the right lower lobe and then the right middle and upper lobes.  This demonstrated significant thrombus burden in the left upper lobe, lower lobe, and middle lobe pulmonary arteries.  4000 Units of heparin was then given and allowed to circulate.  TPA was reconstituted and delivered onto the table. A total of 8 milligrams of TPA was utilized.  4 was administered on the left side and 4 was administered on the right side. This was then allowed to dwell.  The Penumbra Cat 12 catheter was then advanced  up into the pulmonary vasculature. The left lung was addressed first. Catheter was negotiated into the left upper lobe and mechanical thrombectomy was performed with the help of the separator. Follow-up imaging demonstrated a good result and therefore the catheter was  renegotiated into the left lower lobe pulmonary artery and again mechanical thrombectomy was performed. Passes were made with both the Penumbra catheter itself as well as introducing the separator. Follow-up imaging was then performed.  Initially, there was still a large chunk of thrombus in the left lower lobe pulmonary artery so I made another pass with the penumbra CAT 12 catheter and the separator and significant improvement was seen following the second pass in the left lower lobe pulmonary artery.  The Penumbra Cat 12 catheter was then negotiated to the opposite side. The right lung was then addressed. Catheter was negotiated into the right lower lobe pulmonary artery and mechanical thrombectomy was performed with the separator. Follow-up imaging demonstrated a good result and therefore the catheter was renegotiated into the right middle lobe pulmonary artery and again mechanical thrombectomy was performed. Passes were made with both the Penumbra catheter itself as well as introducing the separator.  Finally, was able to negotiate into the right upper lobe pulmonary artery where mechanical thrombectomy was performed with the help of the separator using the penumbra CAT 12 catheter.  Follow-up imaging was then performed.  Significant improvement was seen on the right side so I elected to terminate the procedure.  After review these images wires were reintroduced and the catheter is removed. Then, the sheath is then pulled, the proglide device is secured, a Monocryl skin suture was placed and pressure is held. A sterile dressing is placed.    Findings:   Right heart imaging:  Right atrium and right ventricle and the pulmonary outflow tract appears dilated  Right lung:  This demonstrated significant thrombus burden in the left upper lobe, lower lobe, and middle lobe pulmonary arteries.  Left lung:  This demonstrated significant thrombus burden in both left upper and left lower lobe with more in the  left lower lobe.     Disposition: Patient was taken to the recovery room in stable condition having tolerated the procedure well.  [name] 12/29/2022,5:33 PM

## 2022-12-29 NOTE — Hospital Course (Signed)
47 year old female with no significant past medical history presents to the ER with 4 to 5 days of shortness of breath and chest pain and tachycardia.  Patient was found to have bilateral distal right and left pulmonary arteries extending into the lobar arteries and bilateral segmental and segmental branches.  CT scan commented on right heart strain.  Ultrasound of the lower extremity shows acute occlusive DVT left femoral, popliteal and calf veins. Patient is seen by vascular surgery, pulmonary thrombectomy was performed on 11/12.  Patient doing well today, transition to Eliquis. Echocardiogram showed normal ejection fraction without pulm hypertension.  Patient is medically stable for discharge.

## 2022-12-29 NOTE — Progress Notes (Signed)
  Progress Note   Patient: Jenna Mccarty VWU:[id number] DOB: [birth year] DOA: 12/28/2022     1 DOS: the patient was seen and examined on 12/29/2022   Brief hospital course: 47 year old female with no significant past medical history presents to the ER with 4 to 5 days of shortness of breath and chest pain and tachycardia.  Patient was found to have bilateral distal right and left pulmonary arteries extending into the lobar arteries and bilateral segmental and segmental branches.  CT scan commented on right heart strain.  Ultrasound of the lower extremity shows acute occlusive DVT left femoral, popliteal and calf veins.  11/12.  Patient feeling better.  N.p.o. for vascular procedures today.  Assessment and Plan: * Pulmonary emboli (HCC) Bilateral pulmonary emboli with right heart strain on CT scan.  Vascular surgery to do thrombectomy today.  Patient NPO.  Currently on heparin drip.  Risk factor could be birth control pill.   Acute deep vein thrombosis (DVT) of femoral vein of left lower extremity (HCC) Sonogram showing acute occlusive DVT of the left femoral, popliteal and calf veins.  Contraception management Patient states she will stop her birth control pills.  Will have to use some other form of contraception.  Obesity (BMI 30-39.9) BMI 33.28  Myocardial injury Elevated troponin is demand ischemia from pulmonary emboli.  This is not a myocardial infarction.  Troponins trending down to 55 down to 145.        Subjective: Today patient feels well.  Offers no complaints of chest pain or shortness of breath.  No pain in her leg.  Patient states she is going to stop her birth control pill.  Admitted with bilateral PE  Physical Exam: Vitals:   12/29/22 0727 12/29/22 0729 12/29/22 1000 12/29/22 1258  BP:  121/80 (!) 121/91 115/83  Pulse:  87 82 82  Resp:  17 (!) 21 18  Temp: 98.8 F (37.1 C)  98.7 F (37.1 C) 98.8 F (37.1 C)  TempSrc: Oral  Oral Oral  SpO2:  98% 96%  96%  Weight:      Height:       Physical Exam HENT:     Head: Normocephalic.  Eyes:     General: Lids are normal.     Conjunctiva/sclera: Conjunctivae normal.  Cardiovascular:     Rate and Rhythm: Normal rate and regular rhythm.     Heart sounds: Normal heart sounds, S1 normal and S2 normal.  Pulmonary:     Breath sounds: No decreased breath sounds, wheezing, rhonchi or rales.  Abdominal:     Palpations: Abdomen is soft.     Tenderness: There is no abdominal tenderness.  Musculoskeletal:     Right lower leg: No swelling.     Left lower leg: No swelling.  Skin:    General: Skin is warm.     Findings: No rash.  Neurological:     Mental Status: She is alert and oriented to person, place, and time.     Data Reviewed: CT scan and sonogram of the lower extremity reviewed and documented above Troponin down to 145, creatinine 0.78, hemoglobin 11.5  Family Communication: Family at bedside  Disposition: Status is: Inpatient Remains inpatient appropriate because: N.p.o. for vascular procedure today  Planned Discharge Destination: Home    Time spent: 28 minutes  Author: Alford Highland, MD 12/29/2022 1:28 PM  For on call review [email or web address].

## 2022-12-29 NOTE — Assessment & Plan Note (Signed)
Elevated troponin is demand ischemia from pulmonary emboli.  This is not a myocardial infarction.  Troponins trending down to 55 down to 145.

## 2022-12-29 NOTE — Assessment & Plan Note (Signed)
Sonogram showing acute occlusive DVT of the left femoral, popliteal and calf veins.

## 2022-12-29 NOTE — Assessment & Plan Note (Signed)
BMI 33.28

## 2022-12-29 NOTE — Progress Notes (Signed)
PHARMACY - ANTICOAGULATION CONSULT NOTE  Pharmacy Consult for Heparin Infusion Indication: chest pain/ACS  Allergies  Allergen Reactions   Penicillins Hives    Patient Measurements: Height: 5\' 5"  (165.1 cm) Weight: 90.7 kg (200 lb) IBW/kg (Calculated) : 57 Heparin Dosing Weight: 77.1 kg  Vital Signs: Temp: 98.8 F (37.1 C) (11/12 1258) Temp Source: Oral (11/12 1258) BP: 115/83 (11/12 1258) Pulse Rate: 82 (11/12 1258)  Labs: Recent Labs    12/28/22 1133 12/28/22 1246 12/28/22 1326 12/28/22 2016 12/29/22 0551 12/29/22 1308  HGB 12.3  --   --   --  11.5*  --   HCT 37.8  --   --   --  33.8*  --   PLT 241  --   --   --  212  --   APTT  --  27  --   --   --   --   LABPROT  --  13.3  --   --   --   --   INR  --  1.0  --   --   --   --   HEPARINUNFRC  --   --   --  <0.10* 0.25* 0.21*  CREATININE 0.87  --   --   --  0.78  --   TROPONINIHS 255*  --  253* 145*  --   --     Estimated Creatinine Clearance: 99.9 mL/min (by C-G formula based on SCr of 0.78 mg/dL).   Medical History: Past Medical History:  Diagnosis Date   Cervical dysplasia    Hx of dysplastic nevus ~2018   L med ankle, txted by Dr. Cheree Ditto   Hx of dysplastic nevus ~2018   L vulva, txted by Dr. Cheree Ditto   Hx of dysplastic nevus    L upper abdomen, txed by Dr. Cheree Ditto     Assessment: Patient is a 47 year old female diagnosed with ACS/STEMI. Pharmacy was consulted to initiate patient on a heparin infusion. Patient was not on anticoagulation prior to admission.   Baseline INR and aPTT ordered  No signs/symptoms of bleeding noted. Hgb 12.3. PLT 241.  Goal of Therapy:  Heparin level 0.3-0.7 units/ml Monitor platelets by anticoagulation protocol: Yes  Date Time Results Comments 11/11 2016 HL < 0.1 subtherapeutic 11/12 0551 HL 0.25 Subtherapeutic 11/12 1308 HL 0.21 Subtherapeutic   Plan:  Bolus 1200 units x 1 Increase heparin infusion to 1500 units/hr Recheck HL in 6 hrs after rate change CBC  daily while on heparin  Davian Hanshaw Rodriguez-Guzman PharmD, BCPS 12/29/2022 2:38 PM

## 2022-12-29 NOTE — TOC Benefit Eligibility Note (Signed)
Patient Product/process development scientist completed.    The patient is insured through [employer]. Patient has [employer], may use a copay card, and/or apply for patient assistance if available.    Ran test claim for Eliquis 5 mg and the current 30 day co-pay is $30.00.  Ran test claim for Xarelto 20 mg and the current 30 day co-pay is $30.00.  This test claim was processed through [facility]- copay amounts may vary at other pharmacies due to pharmacy/plan contracts, or as the patient moves through the different stages of their insurance plan.     Roland Earl, CPHT Pharmacy Technician III Certified Patient Advocate [facility] Pharmacy Patient Advocate Team Direct Number: [phone]  Fax: [phone]

## 2022-12-29 NOTE — Interval H&P Note (Signed)
History and Physical Interval Note:  12/29/2022 4:07 PM  Jenna Mccarty  has presented today for surgery, with the diagnosis of Bilateral Pulmonary Embolism with heart strain..  The various methods of treatment have been discussed with the patient and family. After consideration of risks, benefits and other options for treatment, the patient has consented to  Procedure(s): PULMONARY THROMBECTOMY (N/A) as a surgical intervention.  The patient's history has been reviewed, patient examined, no change in status, stable for surgery.  I have reviewed the patient's chart and labs.  Questions were answered to the patient's satisfaction.     Festus Barren

## 2022-12-29 NOTE — Assessment & Plan Note (Deleted)
Elevated troponin demand ischemia from pulmonary embolism.  This is not a myocardial infarction.  Troponins trending down.  255 down to 145.

## 2022-12-29 NOTE — Progress Notes (Signed)
PHARMACY - ANTICOAGULATION CONSULT NOTE  Pharmacy Consult for Heparin Infusion Indication: pulmonary embolus  Allergies  Allergen Reactions   Penicillins Hives    Patient Measurements: Height: 5\' 5"  (165.1 cm) Weight: 90.7 kg (199 lb 15.3 oz) IBW/kg (Calculated) : 57 Heparin Dosing Weight: 77.1 kg  Vital Signs: Temp: 98.5 F (36.9 C) (11/12 1955) Temp Source: Oral (11/12 1955) BP: 115/75 (11/12 1955) Pulse Rate: 93 (11/12 1955)  Labs: Recent Labs    12/28/22 1133 12/28/22 1246 12/28/22 1326 12/28/22 2016 12/28/22 2016 12/29/22 0551 12/29/22 1308 12/29/22 2108  HGB 12.3  --   --   --   --  11.5*  --   --   HCT 37.8  --   --   --   --  33.8*  --   --   PLT 241  --   --   --   --  212  --   --   APTT  --  27  --   --   --   --   --   --   LABPROT  --  13.3  --   --   --   --   --   --   INR  --  1.0  --   --   --   --   --   --   HEPARINUNFRC  --   --   --  <0.10*   < > 0.25* 0.21* 0.28*  CREATININE 0.87  --   --   --   --  0.78  --   --   TROPONINIHS 255*  --  253* 145*  --   --   --   --    < > = values in this interval not displayed.    Estimated Creatinine Clearance: 99.9 mL/min (by C-G formula based on SCr of 0.78 mg/dL).   Medical History: Past Medical History:  Diagnosis Date   Cervical dysplasia    Hx of dysplastic nevus ~2018   L med ankle, txted by Dr. Cheree Ditto   Hx of dysplastic nevus ~2018   L vulva, txted by Dr. Cheree Ditto   Hx of dysplastic nevus    L upper abdomen, txed by Dr. Cheree Ditto     Assessment: Patient is a 47 year old female diagnosed with symptomatic bilateral pulmonary emboli. Pharmacy was consulted to dose and monitor heparin infusion. Patient was not on anticoagulation prior to admission.   Baseline INR and aPTT ordered  No signs/symptoms of bleeding noted. Hgb 12.3. PLT 241.  Goal of Therapy:  Heparin level 0.3-0.7 units/ml Monitor platelets by anticoagulation protocol: Yes  Date Time Results Comments 11/11 2016 HL <  0.1 subtherapeutic 11/12 0551 HL 0.25 Subtherapeutic 11/12 1308 HL 0.21 Subtherapeutic 11/12 2108 HL 0.28 Subtherapeutic   Plan:  Give heparin bolus of 1200 units x1 Increase heparin infusion to 1650 units/hour Check 6 hour Anti-Xa level Monitor daily Anti-Xa levels while on heparin infusion Monitor CBC and signs/symptoms of bleeding  Thank you for involving pharmacy in this patient's care.   Rockwell Alexandria, PharmD Clinical Pharmacist 12/29/2022 9:45 PM

## 2022-12-30 ENCOUNTER — Encounter: Payer: Self-pay | Admitting: Vascular Surgery

## 2022-12-30 ENCOUNTER — Inpatient Hospital Stay
Admit: 2022-12-30 | Discharge: 2022-12-30 | Disposition: A | Discharge status: Home / Self Care | Payer: No Typology Code available for payment source | Attending: Vascular Surgery | Admitting: Vascular Surgery

## 2022-12-30 DIAGNOSIS — I5A Non-ischemic myocardial injury (non-traumatic): Secondary | ICD-10-CM | POA: Diagnosis not present

## 2022-12-30 DIAGNOSIS — I82412 Acute embolism and thrombosis of left femoral vein: Secondary | ICD-10-CM | POA: Diagnosis not present

## 2022-12-30 DIAGNOSIS — I2609 Other pulmonary embolism with acute cor pulmonale: Secondary | ICD-10-CM | POA: Diagnosis not present

## 2022-12-30 LAB — CBC
HCT: 30.6 % — ABNORMAL LOW (ref 36.0–46.0)
Hemoglobin: 10.2 g/dL — ABNORMAL LOW (ref 12.0–15.0)
MCH: 29 pg (ref 26.0–34.0)
MCHC: 33.3 g/dL (ref 30.0–36.0)
MCV: 86.9 fL (ref 80.0–100.0)
Platelets: 200 10*3/uL (ref 150–400)
RBC: 3.52 MIL/uL — ABNORMAL LOW (ref 3.87–5.11)
RDW: 12.8 % (ref 11.5–15.5)
WBC: 7.9 10*3/uL (ref 4.0–10.5)
nRBC: 0 % (ref 0.0–0.2)

## 2022-12-30 LAB — ECHOCARDIOGRAM COMPLETE
AR max vel: 2.65 cm2
AV Area VTI: 2.77 cm2
AV Area mean vel: 2.48 cm2
AV Mean grad: 4 mm[Hg]
AV Peak grad: 9.4 mm[Hg]
Ao pk vel: 1.53 m/s
Area-P 1/2: 3.65 cm2
Height: 65 in
MV VTI: 3.14 cm2
S' Lateral: 2.4 cm
Weight: 3199.32 [oz_av]

## 2022-12-30 LAB — BASIC METABOLIC PANEL
Anion gap: 7 (ref 5–15)
BUN: 16 mg/dL (ref 6–20)
CO2: 21 mmol/L — ABNORMAL LOW (ref 22–32)
Calcium: 8.3 mg/dL — ABNORMAL LOW (ref 8.9–10.3)
Chloride: 109 mmol/L (ref 98–111)
Creatinine, Ser: 0.83 mg/dL (ref 0.44–1.00)
GFR, Estimated: >60 mL/min (ref >60)
Glucose, Bld: 110 mg/dL — ABNORMAL HIGH (ref 70–99)
Potassium: 3.9 mmol/L (ref 3.5–5.1)
Sodium: 137 mmol/L (ref 135–145)

## 2022-12-30 LAB — HEPARIN LEVEL (UNFRACTIONATED): Heparin Unfractionated: 0.47 [IU]/mL (ref 0.30–0.70)

## 2022-12-30 MED ORDER — APIXABAN 5 MG PO TABS: ORAL_TABLET | ORAL | 0 refills | Status: DC

## 2022-12-30 MED ORDER — APIXABAN 5 MG PO TABS: 5.0000 mg | ORAL_TABLET | Freq: Two times a day (BID) | ORAL | Status: DC

## 2022-12-30 MED ORDER — APIXABAN 5 MG PO TABS
10.0000 mg | ORAL_TABLET | Freq: Two times a day (BID) | ORAL | Status: DC
  Administered 2022-12-30 (×2): 10 mg via ORAL
  Filled 2022-12-30 (×2): qty 2

## 2022-12-30 NOTE — Progress Notes (Signed)
Transition of Care [facility]) - Inpatient Brief Assessment   Patient Details  Name: Jenna Mccarty MRN: [id number] Date of Birth: [birth year]  Transition of Care [facility]) CM/SW Contact:    Truddie Hidden, RN Phone Number: 12/30/2022, 10:58 AM   Clinical Narrative: TOC continuing to follow patient's progress throughout discharge planning.   Transition of Care Asessment: Insurance and Status: Insurance coverage has been reviewed Patient has primary care physician: Yes Home environment has been reviewed: Home Prior level of function:: independent Prior/Current Home Services: No current home services Social Determinants of Health Reivew: SDOH reviewed no interventions necessary Readmission risk has been reviewed: Yes Transition of care needs: no transition of care needs at this time

## 2022-12-30 NOTE — Progress Notes (Signed)
  Progress Note    12/30/2022 6:54 AM 1 Day Post-Op  Subjective:  Jenna Mccarty is a 47 yo female now POD #1 from pulmonary thrombectomy. Patient resting comfortably in bed this morning.  Patient not requiring oxygen. She endorses feeling much better. Continues on Heparin this morning. No complications to note. Vitals all remain stable.   Vitals:   12/29/22 2333 12/30/22 0425  BP: 102/63 110/73  Pulse: 73 77  Resp: 18 20  Temp: 99 F (37.2 C) 98.7 F (37.1 C)  SpO2: 96% 95%   Physical Exam: Cardiac:  RRR, Normal S1, S2. No murmur Lungs:  Clear on auscultation, non labored breathing. No rales, rhonchi or wheezing.  Incisions:  Right groin with dressing clean dry and intact.  Extremities:  Bilateral lower extremities warm to touch. Difficult to palpate pulses.  Abdomen:  Positive bowel sounds, soft, non tender and non distended.  Neurologic: Alert oriented to name person and place.   CBC    Component Value Date/Time   WBC 7.9 12/30/2022 0428   RBC 3.52 (L) 12/30/2022 0428   HGB 10.2 (L) 12/30/2022 0428   HCT 30.6 (L) 12/30/2022 0428   PLT 200 12/30/2022 0428   MCV 86.9 12/30/2022 0428   MCH 29.0 12/30/2022 0428   MCHC 33.3 12/30/2022 0428   RDW 12.8 12/30/2022 0428    BMET    Component Value Date/Time   NA 137 12/30/2022 0428   NA 140 05/07/2022 0844   K 3.9 12/30/2022 0428   CL 109 12/30/2022 0428   CO2 21 (L) 12/30/2022 0428   GLUCOSE 110 (H) 12/30/2022 0428   BUN 16 12/30/2022 0428   BUN 17 05/07/2022 0844   CREATININE 0.83 12/30/2022 0428   CALCIUM 8.3 (L) 12/30/2022 0428   GFRNONAA >60 12/30/2022 0428   GFRAA 85 04/03/2019 0849    INR    Component Value Date/Time   INR 1.0 12/28/2022 1246     Intake/Output Summary (Last 24 hours) at 12/30/2022 0654 Last data filed at 12/30/2022 0314 Gross per 24 hour  Intake 1706.58 ml  Output 800 ml  Net 906.58 ml     Assessment/Plan:  47 y.o. female is s/p pulmonary thrombectomy 1 Day Post-Op    PLAN: Okay per vascular surgery for patient to discharge home today after converted to oral anticoagulation. Stop Heparin. Start Eliquis 10 mg twice daily for 7 days then change to 5 mg twice daily indefinitely. PT/OT Eval Follow up with [facility] in 1 month for check.    DVT prophylaxis:  Heparin Infusion converted to oral anticoagulation Eliquis 10 mg BID  I discussed the plan with Jenna Festus Barren MD and he is in agreement with the plan.    Jenna Mccarty [facility] 12/30/2022 6:54 AM

## 2022-12-30 NOTE — Plan of Care (Signed)
  Problem: Education: Goal: Knowledge of General Education information will improve Description: Including pain rating scale, medication(s)/side effects and non-pharmacologic comfort measures 12/30/2022 1626 by Lars Pinks, RN Outcome: Adequate for Discharge 12/30/2022 1400 by Lars Pinks, RN Outcome: Progressing   Problem: Health Behavior/Discharge Planning: Goal: Ability to manage health-related needs will improve 12/30/2022 1626 by Lars Pinks, RN Outcome: Adequate for Discharge 12/30/2022 1400 by Lars Pinks, RN Outcome: Progressing   Problem: Clinical Measurements: Goal: Ability to maintain clinical measurements within normal limits will improve 12/30/2022 1626 by Lars Pinks, RN Outcome: Adequate for Discharge 12/30/2022 1400 by Lars Pinks, RN Outcome: Progressing Goal: Will remain free from infection 12/30/2022 1626 by Lars Pinks, RN Outcome: Adequate for Discharge 12/30/2022 1400 by Lars Pinks, RN Outcome: Progressing Goal: Diagnostic test results will improve 12/30/2022 1626 by Lars Pinks, RN Outcome: Adequate for Discharge 12/30/2022 1400 by Lars Pinks, RN Outcome: Progressing Goal: Respiratory complications will improve 12/30/2022 1626 by Lars Pinks, RN Outcome: Adequate for Discharge 12/30/2022 1400 by Lars Pinks, RN Outcome: Progressing Goal: Cardiovascular complication will be avoided 12/30/2022 1626 by Lars Pinks, RN Outcome: Adequate for Discharge 12/30/2022 1400 by Lars Pinks, RN Outcome: Progressing   Problem: Activity: Goal: Risk for activity intolerance will decrease 12/30/2022 1626 by Lars Pinks, RN Outcome: Adequate for Discharge 12/30/2022 1400 by Lars Pinks, RN Outcome: Progressing   Problem: Nutrition: Goal: Adequate nutrition will be maintained 12/30/2022 1626 by Lars Pinks, RN Outcome: Adequate for Discharge 12/30/2022 1400 by Lars Pinks, RN Outcome: Progressing   Problem: Coping: Goal: Level of anxiety will decrease 12/30/2022 1626 by Lars Pinks, RN Outcome: Adequate for Discharge 12/30/2022 1400 by Lars Pinks, RN Outcome: Progressing   Problem: Elimination: Goal: Will not experience complications related to bowel motility 12/30/2022 1626 by Lars Pinks, RN Outcome: Adequate for Discharge 12/30/2022 1400 by Lars Pinks, RN Outcome: Progressing Goal: Will not experience complications related to urinary retention 12/30/2022 1626 by Lars Pinks, RN Outcome: Adequate for Discharge 12/30/2022 1400 by Lars Pinks, RN Outcome: Progressing   Problem: Pain Management: Goal: General experience of comfort will improve 12/30/2022 1626 by Lars Pinks, RN Outcome: Adequate for Discharge 12/30/2022 1400 by Lars Pinks, RN Outcome: Progressing   Problem: Safety: Goal: Ability to remain free from injury will improve 12/30/2022 1626 by Lars Pinks, RN Outcome: Adequate for Discharge 12/30/2022 1400 by Lars Pinks, RN Outcome: Progressing   Problem: Skin Integrity: Goal: Risk for impaired skin integrity will decrease 12/30/2022 1626 by Lars Pinks, RN Outcome: Adequate for Discharge 12/30/2022 1400 by Lars Pinks, RN Outcome: Progressing

## 2022-12-30 NOTE — Consult Note (Signed)
PHARMACY - ANTICOAGULATION CONSULT NOTE  Pharmacy Consult for Apixaban Indication: pulmonary embolus  Allergies  Allergen Reactions   Penicillins Hives    Patient Measurements: Height: 5\' 5"  (165.1 cm) Weight: 90.7 kg (199 lb 15.3 oz) IBW/kg (Calculated) : 57   Vital Signs: Temp: 98.8 F (37.1 C) (11/13 0745) Temp Source: Oral (11/13 0745) BP: 117/74 (11/13 0745) Pulse Rate: 72 (11/13 0745)  Labs: Recent Labs    12/28/22 1133 12/28/22 1246 12/28/22 1326 12/28/22 2016 12/28/22 2016 12/29/22 0551 12/29/22 1308 12/29/22 2108 12/30/22 0428  HGB 12.3  --   --   --   --  11.5*  --   --  10.2*  HCT 37.8  --   --   --   --  33.8*  --   --  30.6*  PLT 241  --   --   --   --  212  --   --  200  APTT  --  27  --   --   --   --   --   --   --   LABPROT  --  13.3  --   --   --   --   --   --   --   INR  --  1.0  --   --   --   --   --   --   --   HEPARINUNFRC  --   --   --  <0.10*   < > 0.25* 0.21* 0.28* 0.47  CREATININE 0.87  --   --   --   --  0.78  --   --  0.83  TROPONINIHS 255*  --  253* 145*  --   --   --   --   --    < > = values in this interval not displayed.   Estimated Creatinine Clearance: 96.3 mL/min (by C-G formula based on SCr of 0.83 mg/dL).  Medical History: Past Medical History:  Diagnosis Date   Cervical dysplasia    Hx of dysplastic nevus ~2018   L med ankle, txted by Dr. Cheree Ditto   Hx of dysplastic nevus ~2018   L vulva, txted by Dr. Cheree Ditto   Hx of dysplastic nevus    L upper abdomen, txed by Dr. Cheree Ditto   Assessment: 47yo female admitted to ED for SOB and respiratory failure. PMH unremarkable. Dx with bilateral PE. Pharmacy consulted to dose Apixaban. Cr Cl = 96.62ml/min   Plan:  Apixaban 10mg  po BID x 7 days, then 5mg  po BID. End date to be determine by provider.  Nevaan Bunton Rodriguez-Guzman PharmD, BCPS 12/30/2022 8:29 AM

## 2022-12-30 NOTE — Plan of Care (Signed)

## 2022-12-30 NOTE — Discharge Summary (Signed)
Physician Discharge Summary   Patient: Jenna Mccarty MRN: [id number] DOB: Jul 23, 1975  Admit date:     12/28/2022  Discharge date: 12/30/22  Discharge Physician: Marrion Coy   PCP: Pcp, No   Recommendations at discharge:   Follow-up with PCP in 1 to 2 weeks, TOC to arrange. Follow-up with vascular surgery in 1 to 2 weeks.  Discharge Diagnoses: Principal Problem:   Pulmonary emboli (HCC) Active Problems:   Acute deep vein thrombosis (DVT) of femoral vein of left lower extremity (HCC)   Contraception management   Myocardial injury   Obesity (BMI 30-39.9) Minimal metabolic acidosis. Resolved Problems:   * No resolved hospital problems. *  Hospital Course: 47 year old female with no significant past medical history presents to the ER with 4 to 5 days of shortness of breath and chest pain and tachycardia.  Patient was found to have bilateral distal right and left pulmonary arteries extending into the lobar arteries and bilateral segmental and segmental branches.  CT scan commented on right heart strain.  Ultrasound of the lower extremity shows acute occlusive DVT left femoral, popliteal and calf veins. Patient is seen by vascular surgery, pulmonary thrombectomy was performed on 11/12.  Patient doing well today, transition to Eliquis. Echocardiogram showed normal ejection fraction without pulm hypertension.  Patient is medically stable for discharge.  Assessment and Plan: * Pulmonary emboli (HCC) with acute cor pulmonale. Acute deep vein thrombosis (DVT) of femoral vein of left lower extremity (HCC) Sonogram showing acute occlusive DVT of the left femoral, popliteal and calf veins.  CT angiogram showed bilateral PE with a right heart strain. Patient was initially treated with heparin drip, thrombectomy were performed on 11/12.  Transition to Eliquis today. Patient is medically stable for discharge today.  Contraception management Patient states she will stop her birth  control pills.  Will have to use some other form of contraception.  Obesity (BMI 30-39.9) BMI 33.28  Myocardial injury Elevated troponin is demand ischemia from pulmonary emboli.  This is not a myocardial infarction.  Troponins trending down to 55 from145.        Consultants: Vascular surgery. Procedures performed: Pulmonary thrombectomy. Disposition: Home Diet recommendation:  Discharge Diet Orders (From admission, onward)     Start     Ordered   12/30/22 0000  Diet - low sodium heart healthy        12/30/22 1618           Cardiac diet DISCHARGE MEDICATION: Allergies as of 12/30/2022       Reactions   Penicillins Hives        Medication List     STOP taking these medications    meloxicam 15 MG tablet Commonly known as: MOBIC   norgestimate-ethinyl estradiol 0.25-35 MG-MCG tablet Commonly known as: Estarylla   Norgestimate-Ethinyl Estradiol Triphasic 0.18/0.215/0.25 MG-35 MCG tablet       TAKE these medications    apixaban 5 MG Tabs tablet Commonly known as: ELIQUIS Take 2 tablets (10 mg total) by mouth 2 (two) times daily for 7 days, THEN 1 tablet (5 mg total) 2 (two) times daily. Start taking on: December 30, 2022   cetirizine 10 MG tablet Commonly known as: ZYRTEC Take by mouth.   MULTIVITAMIN PO Take by mouth.   omeprazole 20 MG capsule Commonly known as: PRILOSEC Take by mouth.        Follow-up Information     Dew, Marlow Baars, MD Follow up in 1 week(s).   Specialties: Vascular Surgery, Radiology, Interventional  Cardiology Contact information: [address] [place] [address] [phone]                Discharge Exam: Ceasar Mons Weights   12/28/22 1131 12/29/22 1612  Weight: 90.7 kg 90.7 kg   General exam: Appears calm and comfortable  Respiratory system: Clear to auscultation. Respiratory effort normal. Cardiovascular system: S1 & S2 heard, RRR. No JVD, murmurs, rubs, gallops or clicks. No pedal  edema. Gastrointestinal system: Abdomen is nondistended, soft and nontender. No organomegaly or masses felt. Normal bowel sounds heard. Central nervous system: Alert and oriented. No focal neurological deficits. Extremities: Symmetric 5 x 5 power. Skin: No rashes, lesions or ulcers Psychiatry: Judgement and insight appear normal. Mood & affect appropriate.    Condition at discharge: good  The results of significant diagnostics from this hospitalization (including imaging, microbiology, ancillary and laboratory) are listed below for reference.   Imaging Studies: ECHOCARDIOGRAM COMPLETE  Result Date: 12/30/2022    ECHOCARDIOGRAM REPORT   Patient Name:   Jenna Mccarty Date of Exam: 12/30/2022 Medical Rec #:  [id number]               Height:       65.0 in Accession #:    [id number]              Weight:       200.0 lb Date of Birth:  14-Apr-1975              BSA:          1.978 m Patient Age:    46 years                BP:           117/74 mmHg Patient Gender: F                       HR:           68 bpm. Exam Location:  [facility] Procedure: 2D Echo, Cardiac Doppler and Color Doppler Indications:     NSTEMI  History:         Patient has no prior history of Echocardiogram examinations.                  Acute MI. Pulmonary embolus.  Sonographer:     Mikki Harbor Referring Phys:  [id number] Marlow Baars DEW Diagnosing Phys: Julien Nordmann MD IMPRESSIONS  1. Left ventricular ejection fraction, by estimation, is 60 to 65%. The left ventricle has normal function. The left ventricle has no regional wall motion abnormalities. Left ventricular diastolic parameters were normal.  2. Right ventricular systolic function is low normal. The right ventricular size is moderately enlarged. There is normal pulmonary artery systolic pressure. The estimated right ventricular systolic pressure is 27.2 mmHg.  3. The mitral valve is normal in structure. Mild mitral valve regurgitation. No evidence of mitral stenosis.  4.  Tricuspid valve regurgitation is mild to moderate.  5. The aortic valve is tricuspid. Aortic valve regurgitation is not visualized. No aortic stenosis is present.  6. The inferior vena cava is normal in size with greater than 50% respiratory variability, suggesting right atrial pressure of 3 mmHg. FINDINGS  Left Ventricle: Left ventricular ejection fraction, by estimation, is 60 to 65%. The left ventricle has normal function. The left ventricle has no regional wall motion abnormalities. The left ventricular internal cavity size was normal in size. There is  no left ventricular hypertrophy.  Left ventricular diastolic parameters were normal. Right Ventricle: The right ventricular size is moderately enlarged. No increase in right ventricular wall thickness. Right ventricular systolic function is low normal. There is normal pulmonary artery systolic pressure. The tricuspid regurgitant velocity  is 2.19 m/s, and with an assumed right atrial pressure of 8 mmHg, the estimated right ventricular systolic pressure is 27.2 mmHg. Left Atrium: Left atrial size was normal in size. Right Atrium: Right atrial size was normal in size. Pericardium: There is no evidence of pericardial effusion. Mitral Valve: The mitral valve is normal in structure. Mild mitral valve regurgitation. No evidence of mitral valve stenosis. MV peak gradient, 3.5 mmHg. The mean mitral valve gradient is 2.0 mmHg. Tricuspid Valve: The tricuspid valve is normal in structure. Tricuspid valve regurgitation is mild to moderate. No evidence of tricuspid stenosis. Aortic Valve: The aortic valve is tricuspid. Aortic valve regurgitation is not visualized. No aortic stenosis is present. Aortic valve mean gradient measures 4.0 mmHg. Aortic valve peak gradient measures 9.4 mmHg. Aortic valve area, by VTI measures 2.77 cm. Pulmonic Valve: The pulmonic valve was normal in structure. Pulmonic valve regurgitation is not visualized. No evidence of pulmonic stenosis. Aorta:  The aortic Mccarty is normal in size and structure. Venous: The inferior vena cava is normal in size with greater than 50% respiratory variability, suggesting right atrial pressure of 3 mmHg. IAS/Shunts: No atrial level shunt detected by color flow Doppler.  LEFT VENTRICLE PLAX 2D LVIDd:         4.70 cm   Diastology LVIDs:         2.40 cm   LV e' medial:    11.30 cm/s LV PW:         0.60 cm   LV E/e' medial:  7.9 LV IVS:        1.10 cm   LV e' lateral:   14.40 cm/s LVOT diam:     2.00 cm   LV E/e' lateral: 6.2 LV SV:         80 LV SV Index:   41 LVOT Area:     3.14 cm  RIGHT VENTRICLE RV Basal diam:  4.30 cm RV Mid diam:    3.20 cm RV S prime:     11.60 cm/s TAPSE (M-mode): 2.4 cm LEFT ATRIUM             Index        RIGHT ATRIUM           Index LA diam:        3.80 cm 1.92 cm/m   RA Area:     17.90 cm LA Vol (A2C):   59.1 ml 29.88 ml/m  RA Volume:   53.80 ml  27.20 ml/m LA Vol (A4C):   49.5 ml 25.03 ml/m LA Biplane Vol: 58.3 ml 29.47 ml/m  AORTIC VALVE                    PULMONIC VALVE AV Area (Vmax):    2.65 cm     PV Vmax:       0.94 m/s AV Area (Vmean):   2.48 cm     PV Peak grad:  3.5 mmHg AV Area (VTI):     2.77 cm AV Vmax:           153.00 cm/s AV Vmean:          88.700 cm/s AV VTI:            0.289 m  AV Peak Grad:      9.4 mmHg AV Mean Grad:      4.0 mmHg LVOT Vmax:         129.00 cm/s LVOT Vmean:        70.000 cm/s LVOT VTI:          0.255 m LVOT/AV VTI ratio: 0.88  AORTA Ao Mccarty diam: 3.20 cm MITRAL VALVE               TRICUSPID VALVE MV Area (PHT): 3.65 cm    TR Peak grad:   19.2 mmHg MV Area VTI:   3.14 cm    TR Vmax:        219.00 cm/s MV Peak grad:  3.5 mmHg MV Mean grad:  2.0 mmHg    SHUNTS MV Vmax:       0.94 m/s    Systemic VTI:  0.26 m MV Vmean:      64.0 cm/s   Systemic Diam: 2.00 cm MV Decel Time: 208 msec MV E velocity: 89.60 cm/s MV A velocity: 84.90 cm/s MV E/A ratio:  1.06 Julien Nordmann MD Electronically signed by Julien Nordmann MD Signature Date/Time: 12/30/2022/4:05:53 PM    Final     PERIPHERAL VASCULAR CATHETERIZATION  Result Date: 12/29/2022 See surgical note for result.  US Venous Img Lower Bilateral (DVT)  Result Date: 12/28/2022 CLINICAL DATA:  Pulmonary embolism EXAM: BILATERAL LOWER EXTREMITY VENOUS DOPPLER ULTRASOUND TECHNIQUE: Gray-scale sonography with graded compression, as well as color Doppler and duplex ultrasound were performed to evaluate the lower extremity deep venous systems from the level of the common femoral vein and including the common femoral, femoral, profunda femoral, popliteal and calf veins including the posterior tibial, peroneal and gastrocnemius veins when visible. The superficial great saphenous vein was also interrogated. Spectral Doppler was utilized to evaluate flow at rest and with distal augmentation maneuvers in the common femoral, femoral and popliteal veins. COMPARISON:  None available FINDINGS: RIGHT LOWER EXTREMITY Common Femoral Vein: No evidence of thrombus. Normal compressibility, respiratory phasicity and response to augmentation. Saphenofemoral Junction: No evidence of thrombus. Normal compressibility and flow on color Doppler imaging. Profunda Femoral Vein: No evidence of thrombus. Normal compressibility and flow on color Doppler imaging. Femoral Vein: No evidence of thrombus. Normal compressibility, respiratory phasicity and response to augmentation. Popliteal Vein: No evidence of thrombus. Normal compressibility, respiratory phasicity and response to augmentation. Calf Veins: No evidence of thrombus. Normal compressibility and flow on color Doppler imaging. Superficial Great Saphenous Vein: No evidence of thrombus. Normal compressibility. Venous Reflux:  None. Other Findings:  None. LEFT LOWER EXTREMITY Common Femoral Vein: No evidence of thrombus. Normal compressibility, respiratory phasicity and response to augmentation. Saphenofemoral Junction: No evidence of thrombus. Normal compressibility and flow on color Doppler imaging.  Profunda Femoral Vein: No evidence of thrombus. Normal compressibility and flow on color Doppler imaging. Femoral Vein: Thrombosed. Popliteal Vein: Thrombosed. Calf Veins: Thrombosed. Superficial Great Saphenous Vein: No evidence of thrombus. Normal compressibility. Venous Reflux:  None. Other Findings:  None. IMPRESSION: Acute occlusive DVT of the left femoral, popliteal, and calf veins. Electronically Signed   By: Acquanetta Belling M.D.   On: 12/28/2022 18:18   DG Chest 2 View  Result Date: 12/28/2022 CLINICAL DATA:  Left-sided chest pain and shortness of breath. EXAM: CHEST - 2 VIEW COMPARISON:  None Available. FINDINGS: The heart size and mediastinal contours are within normal limits. Both lungs are clear. The visualized skeletal structures are unremarkable. IMPRESSION: No active cardiopulmonary disease. Electronically Signed  By: Danae Orleans M.D.   On: 12/28/2022 14:52   CT Angio Chest PE W and/or Wo Contrast  Result Date: 12/28/2022 CLINICAL DATA:  Shortness of breath, elevated heart rate EXAM: CT ANGIOGRAPHY CHEST WITH CONTRAST TECHNIQUE: Multidetector CT imaging of the chest was performed using the standard protocol during bolus administration of intravenous contrast. Multiplanar CT image reconstructions and MIPs were obtained to evaluate the vascular anatomy. RADIATION DOSE REDUCTION: This exam was performed according to the departmental dose-optimization program which includes automated exposure control, adjustment of the mA and/or kV according to patient size and/or use of iterative reconstruction technique. CONTRAST:  75mL OMNIPAQUE IOHEXOL 350 MG/ML SOLN COMPARISON:  None Available. FINDINGS: Cardiovascular: Acute pulmonary embolus seen in the bilateral distal right and left pulmonary arteries extending into the lobar arteries and bilateral segmental and subsegmental branches. Elevated RV to LV ratio of 1.9. Normal overall heart size. No pericardial effusion. No coronary artery calcifications.  Normal caliber thoracic aorta with no significant atherosclerotic disease. Mediastinum/Nodes: Esophagus and thyroid are unremarkable. No enlarged lymph nodes seen in the chest. Lungs/Pleura: central airways are patent. No consolidation, pleural effusion or pneumothorax. Upper Abdomen: No acute abnormality. Musculoskeletal: No chest wall abnormality. No acute or significant osseous findings. Review of the MIP images confirms the above findings. IMPRESSION: 1. Acute pulmonary embolus seen in the bilateral distal right and left pulmonary arteries extending into the lobar arteries and bilateral segmental and subsegmental branches. 2. Elevated RV to LV ratio of 1.9, consistent with right heart strain. Provider Dr.PATRICK ROBINSON, called PRA line to request STAT read and is aware of positive result. Electronically Signed   By: Allegra Lai M.D.   On: 12/28/2022 14:47    Microbiology: Results for orders placed or performed during the hospital encounter of 10/28/18  Urine culture     Status: Abnormal   Collection Time: 10/28/18  9:10 AM   Specimen: Urine, Random  Result Value Ref Range Status   Specimen Description   Final    URINE, RANDOM Performed at [facility], [address], [place], [place] [address]    Special Requests   Final    NONE Performed at [facility], [address], [place], [place] [address]    Culture 10,000 COLONIES/mL ESCHERICHIA COLI (A)  Final   Report Status 10/30/2018 FINAL  Final   Organism ID, Bacteria ESCHERICHIA COLI (A)  Final      Susceptibility   Escherichia coli - MIC*    AMPICILLIN 8 SENSITIVE Sensitive     CEFAZOLIN <=4 SENSITIVE Sensitive     CEFTRIAXONE <=1 SENSITIVE Sensitive     CIPROFLOXACIN <=0.25 SENSITIVE Sensitive     GENTAMICIN <=1 SENSITIVE Sensitive     IMIPENEM <=0.25 SENSITIVE Sensitive     NITROFURANTOIN <=16 SENSITIVE Sensitive     TRIMETH/SULFA <=20 SENSITIVE Sensitive     AMPICILLIN/SULBACTAM 4 SENSITIVE  Sensitive     PIP/TAZO <=4 SENSITIVE Sensitive     Extended ESBL NEGATIVE Sensitive     * 10,000 COLONIES/mL ESCHERICHIA COLI    Labs: CBC: Recent Labs  Lab 12/28/22 1133 12/29/22 0551 12/30/22 0428  WBC 10.9* 8.2 7.9  HGB 12.3 11.5* 10.2*  HCT 37.8 33.8* 30.6*  MCV 89.2 87.8 86.9  PLT 241 212 200   Basic Metabolic Panel: Recent Labs  Lab 12/28/22 1133 12/29/22 0551 12/30/22 0428  NA 137 136 137  K 3.7 3.9 3.9  CL 107 107 109  CO2 21* 19* 21*  GLUCOSE 122*  113* 110*  BUN 12 15 16   CREATININE 0.87 0.78 0.83  CALCIUM 8.7* 8.5* 8.3*   Liver Function Tests: Recent Labs  Lab 12/29/22 0551  AST 22  ALT 24  ALKPHOS 66  BILITOT 0.4  PROT 6.2*  ALBUMIN 3.3*   CBG: No results for input(s): "GLUCAP" in the last 168 hours.  Discharge time spent: greater than 30 minutes.  Signed: Marrion Coy, MD [facility] 12/30/2022

## 2022-12-30 NOTE — Progress Notes (Signed)
PHARMACY - ANTICOAGULATION CONSULT NOTE  Pharmacy Consult for Heparin Infusion Indication: pulmonary embolus  Allergies  Allergen Reactions   Penicillins Hives    Patient Measurements: Height: 5\' 5"  (165.1 cm) Weight: 90.7 kg (199 lb 15.3 oz) IBW/kg (Calculated) : 57 Heparin Dosing Weight: 77.1 kg  Vital Signs: Temp: 98.7 F (37.1 C) (11/13 0425) Temp Source: Oral (11/13 0425) BP: 110/73 (11/13 0425) Pulse Rate: 77 (11/13 0425)  Labs: Recent Labs    12/28/22 1133 12/28/22 1246 12/28/22 1326 12/28/22 2016 12/28/22 2016 12/29/22 0551 12/29/22 1308 12/29/22 2108 12/30/22 0428  HGB 12.3  --   --   --   --  11.5*  --   --  10.2*  HCT 37.8  --   --   --   --  33.8*  --   --  30.6*  PLT 241  --   --   --   --  212  --   --  200  APTT  --  27  --   --   --   --   --   --   --   LABPROT  --  13.3  --   --   --   --   --   --   --   INR  --  1.0  --   --   --   --   --   --   --   HEPARINUNFRC  --   --   --  <0.10*   < > 0.25* 0.21* 0.28* 0.47  CREATININE 0.87  --   --   --   --  0.78  --   --  0.83  TROPONINIHS 255*  --  253* 145*  --   --   --   --   --    < > = values in this interval not displayed.    Estimated Creatinine Clearance: 96.3 mL/min (by C-G formula based on SCr of 0.83 mg/dL).   Medical History: Past Medical History:  Diagnosis Date   Cervical dysplasia    Hx of dysplastic nevus ~2018   L med ankle, txted by Dr. Cheree Ditto   Hx of dysplastic nevus ~2018   L vulva, txted by Dr. Cheree Ditto   Hx of dysplastic nevus    L upper abdomen, txed by Dr. Cheree Ditto     Assessment: Patient is a 47 year old female diagnosed with symptomatic bilateral pulmonary emboli. Pharmacy was consulted to dose and monitor heparin infusion. Patient was not on anticoagulation prior to admission.   Baseline INR and aPTT ordered  No signs/symptoms of bleeding noted. Hgb 12.3. PLT 241.  Goal of Therapy:  Heparin level 0.3-0.7 units/ml Monitor platelets by anticoagulation protocol:  Yes  Date Time Results Comments 11/11 2016 HL < 0.1 subtherapeutic 11/12 0551 HL 0.25 Subtherapeutic 11/12 1308 HL 0.21 Subtherapeutic 11/12 2108 HL 0.28 Subtherapeutic 11/13 0428 HL 0.47 Therapeutic x 1   Plan:  Continue heparin infusion at 1650 units/hour Recheck 6 hour Anti-Xa level to confirm Monitor daily Anti-Xa levels while on heparin infusion Monitor CBC and signs/symptoms of bleeding  Thank you for involving pharmacy in this patient's care.   Otelia Sergeant, PharmD, [facility] 12/30/2022 5:08 AM

## 2022-12-30 NOTE — TOC Progression Note (Signed)
Transition of Care [facility]) - Progression Note    Patient Details  Name: Jenna Mccarty MRN: [id number] Date of Birth: October 08, 1975  [facility]) CM/SW Contact  Truddie Hidden, RN Phone Number: 12/30/2022, 11:01 AM  Clinical Narrative:    PCP list added to AVS.         Expected Discharge Plan and Services                                               Social Determinants of Health (SDOH) Interventions SDOH Screenings   Food Insecurity: No Food Insecurity (12/29/2022)  Housing: Low Risk  (12/29/2022)  Transportation Needs: No Transportation Needs (12/29/2022)  Utilities: Not At Risk (12/29/2022)  Tobacco Use: Low Risk  (12/29/2022)    Readmission Risk Interventions     No data to display

## 2022-12-30 NOTE — Progress Notes (Signed)
*  PRELIMINARY RESULTS* Echocardiogram 2D Echocardiogram has been performed.  Carolyne Fiscal 12/30/2022, 11:21 AM

## 2022-12-30 NOTE — Discharge Instructions (Signed)
Some PCP options in [place] area- not a comprehensive list  [facility]- [phone] [place]- [phone] [facility]- [phone] [facility]- [phone] [facility]- [phone] [place]- [phone]  or [place] [facility]ician Referral Line [phone]  

## 2022-12-31 ENCOUNTER — Telehealth: Payer: Self-pay

## 2022-12-31 ENCOUNTER — Encounter (INDEPENDENT_AMBULATORY_CARE_PROVIDER_SITE_OTHER): Payer: Self-pay | Admitting: Vascular Surgery

## 2022-12-31 NOTE — Telephone Encounter (Signed)
Copied from CRM [id number]. Topic: Appointments - New Patient >> Dec 30, 2022  4:42 PM Phill Myron wrote: Nurse Mignon Pine of [facility] CARD/MED PCU, would like for the patient to have an appointment sooner than January. Please advise

## 2023-01-05 ENCOUNTER — Ambulatory Visit (INDEPENDENT_AMBULATORY_CARE_PROVIDER_SITE_OTHER): Payer: No Typology Code available for payment source | Admitting: Vascular Surgery

## 2023-01-05 ENCOUNTER — Encounter (INDEPENDENT_AMBULATORY_CARE_PROVIDER_SITE_OTHER): Payer: Self-pay | Admitting: Vascular Surgery

## 2023-01-05 ENCOUNTER — Ambulatory Visit: Payer: Self-pay | Admitting: Family Medicine

## 2023-01-05 VITALS — BP 126/86 | HR 82 | Resp 18 | Ht 65.0 in | Wt 219.6 lb

## 2023-01-05 DIAGNOSIS — I82412 Acute embolism and thrombosis of left femoral vein: Secondary | ICD-10-CM

## 2023-01-05 DIAGNOSIS — I2609 Other pulmonary embolism with acute cor pulmonale: Secondary | ICD-10-CM | POA: Diagnosis not present

## 2023-01-05 NOTE — Progress Notes (Signed)
MRN : [id number]  Jenna Mccarty is a 47 y.o. (1975-10-27) female who presents with chief complaint of  Chief Complaint  Patient presents with   Follow-up    fu 1 week  .  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  She underwent pulmonary thrombectomy last week and is doing well.  She says she is back to her baseline of breathing.  Minimal leg pain and swelling at this point.  Tolerating anticoagulation well.  She is wearing her compression socks and elevating her legs.  Current Outpatient Medications  Medication Sig Dispense Refill   Multiple Vitamins-Minerals (MULTIVITAMIN PO) Take by mouth.     apixaban (ELIQUIS) 5 MG TABS tablet Take 1 tablet (5 mg total) by mouth 2 (two) times daily. 60 tablet 2   cetirizine (ZYRTEC) 10 MG tablet Take by mouth.     omeprazole (PRILOSEC) 20 MG capsule Take 1 capsule (20 mg total) by mouth daily. 90 capsule 3   No current facility-administered medications for this visit.    Past Medical History:  Diagnosis Date   Cervical dysplasia    Hx of dysplastic nevus ~2018   L med ankle, txted by Dr. Cheree Ditto   Hx of dysplastic nevus ~2018   L vulva, txted by Dr. Cheree Ditto   Hx of dysplastic nevus    L upper abdomen, txed by Dr. Cheree Ditto    Past Surgical History:  Procedure Laterality Date   COLPOSCOPY  2002   KNEE SURGERY Left 1996   ACL reconstruction - patella tendon graft   PULMONARY THROMBECTOMY N/A 12/29/2022   Procedure: PULMONARY THROMBECTOMY;  Surgeon: Annice Needy, MD;  Location: [facility] INV[facility]IVE [facility];  Service: Cardiovascular;  Laterality: N/A;   TONSILLECTOMY     WISDOM TOOTH EXTRACTION       Social History   Tobacco Use   Smoking status: Never   Smokeless tobacco: Never  Vaping Use   Vaping status: Never Used  Substance Use Topics   Alcohol use: Yes    Comment: social   Drug use: No      Family History  Problem Relation Age of Onset   Diabetes Mellitus II Mother    Other  Mother 29       Pituitary Mass-benign   Thyroid disease Sister        on Synthroid   Diabetes Mellitus II Sister    Diabetes Mellitus II Maternal Grandfather    Other Maternal Grandfather        Pituitary Mass-benign   Diabetes Mellitus II Paternal Grandmother    Deep vein thrombosis Paternal Grandmother        lifetime anticoagulation   Hypothyroidism Paternal Grandmother    Breast cancer Paternal Aunt        pat great aunt   Cancer Other 2       Breast     Allergies  Allergen Reactions   Penicillins Hives     REVIEW OF SYSTEMS (Negative unless checked)  Constitutional: [] Weight loss  [] Fever  [] Chills Cardiac: [] Chest pain   [] Chest pressure   [] Palpitations   [] Shortness of breath when laying flat   [] Shortness of breath at rest   [] Shortness of breath with exertion. Vascular:  [] Pain in legs with walking   [] Pain in legs at rest   [] Pain in legs when laying flat   [] Claudication   [] Pain in feet when walking  [] Pain in feet at rest  [] Pain in  feet when laying flat   [x] History of DVT   [x] Phlebitis   [] Swelling in legs   [] Varicose veins   [] Non-healing ulcers Pulmonary:   [] Uses home oxygen   [] Productive cough   [] Hemoptysis   [] Wheeze  [] COPD   [] Asthma Neurologic:  [] Dizziness  [] Blackouts   [] Seizures   [] History of stroke   [] History of TIA  [] Aphasia   [] Temporary blindness   [] Dysphagia   [] Weakness or numbness in arms   [] Weakness or numbness in legs Musculoskeletal:  [] Arthritis   [] Joint swelling   [] Joint pain   [] Low back pain Hematologic:  [] Easy bruising  [] Easy bleeding   [] Hypercoagulable state   [] Anemic   Gastrointestinal:  [] Blood in stool   [] Vomiting blood  [] Gastroesophageal reflux/heartburn   [] Abdominal pain Genitourinary:  [] Chronic kidney disease   [] Difficult urination  [] Frequent urination  [] Burning with urination   [] Hematuria Skin:  [] Rashes   [] Ulcers   [] Wounds Psychological:  [] History of anxiety   []  History of major  depression.  Physical Examination  BP 126/86 (BP Location: Right Arm)   Pulse 82   Resp 18   Ht 5\' 5"  (1.651 m)   Wt 219 lb 9.6 oz (99.6 kg)   LMP 12/08/2022   BMI 36.54 kg/m  Gen:  WD/WN, NAD Head: [place]/AT, No temporalis wasting. Ear/Nose/Throat: Hearing grossly intact, nares w/o erythema or drainage Eyes: Conjunctiva clear. Sclera non-icteric Neck: Supple.  Trachea midline Pulmonary:  Good air movement, no use of accessory muscles.  Cardiac: RRR, no JVD Vascular:  Vessel Right Left  Radial Palpable Palpable                          PT Palpable Palpable  DP Palpable Palpable   Gastrointestinal: soft, non-tender/non-distended. No guarding/reflex.  Musculoskeletal: M/S 5/5 throughout.  No deformity or atrophy.  No appreciable edema. Neurologic: Sensation grossly intact in extremities.  Symmetrical.  Speech is fluent.  Psychiatric: Judgment intact, Mood & affect appropriate for pt's clinical situation. Dermatologic: No rashes or ulcers noted.  No cellulitis or open wounds.      Labs Recent Results (from the past 2160 hour(s))  Basic metabolic panel     Status: Abnormal   Collection Time: 12/28/22 11:33 AM  Result Value Ref Range   Sodium 137 135 - 145 mmol/L   Potassium 3.7 3.5 - 5.1 mmol/L   Chloride 107 98 - 111 mmol/L   CO2 21 (L) 22 - 32 mmol/L   Glucose, Bld 122 (H) 70 - 99 mg/dL    Comment: Glucose reference range applies only to samples taken after fasting for at least 8 hours.   BUN 12 6 - 20 mg/dL   Creatinine, Ser 0.16 0.44 - 1.00 mg/dL   Calcium 8.7 (L) 8.9 - 10.3 mg/dL   GFR, Estimated >01 >09 mL/min    Comment: (NOTE) Calculated using the CKD-EPI Creatinine Equation (2021)    Anion gap 9 5 - 15    Comment: Performed at [facility], [address] [place], [place] [address]  CBC     Status: Abnormal   Collection Time: 12/28/22 11:33 AM  Result Value Ref Range   WBC 10.9 (H) 4.0 - 10.5 K/uL   RBC 4.24 3.87 - 5.11 MIL/uL   Hemoglobin  12.3 12.0 - 15.0 g/dL   HCT 73.2 20.2 - 54.2 %   MCV 89.2 80.0 - 100.0 fL   MCH 29.0 26.0 - 34.0 pg  MCHC 32.5 30.0 - 36.0 g/dL   RDW 40.9 81.1 - 91.4 %   Platelets 241 150 - 400 K/uL   nRBC 0.0 0.0 - 0.2 %    Comment: Performed at [facility], [address] [place], [place] [address]  Troponin I (High Sensitivity)     Status: Abnormal   Collection Time: 12/28/22 11:33 AM  Result Value Ref Range   Troponin I (High Sensitivity) 255 (HH) <18 ng/L    Comment: CRITICAL RESULT CALLED TO, READ BACK BY AND VERIFIED WITH SUSAN HAMILTON 12/28/22 1219 MW (NOTE) Elevated high sensitivity troponin I (hsTnI) values and significant  changes across serial measurements may suggest ACS but many other  chronic and acute conditions are known to elevate hsTnI results.  Refer to the "Links" section for chest pain algorithms and additional  guidance. Performed at [facility], [address] [place], [place] [address]   hCG, quantitative, pregnancy     Status: None   Collection Time: 12/28/22 11:33 AM  Result Value Ref Range   hCG, Beta Chain, Quant, S <1 <5 mIU/mL    Comment:          GEST. AGE      CONC.  (mIU/mL)   <=1 WEEK        5 - 50     2 WEEKS       50 - 500     3 WEEKS       100 - 10,000     4 WEEKS     1,000 - 30,000     5 WEEKS     3,500 - 115,000   6-8 WEEKS     12,000 - 270,000    12 WEEKS     15,000 - 220,000        FEMALE AND NON-PREGNANT FEMALE:     LESS THAN 5 mIU/mL Performed at [facility], [address] [place], [place] [address]   Protime-INR     Status: None   Collection Time: 12/28/22 12:46 PM  Result Value Ref Range   Prothrombin Time 13.3 11.4 - 15.2 seconds   INR 1.0 0.8 - 1.2    Comment: (NOTE) INR goal varies based on device and disease states. Performed at [facility], [address] [place], [place] [address]   APTT     Status: None   Collection Time: 12/28/22 12:46 PM  Result Value Ref Range   aPTT 27 24  - 36 seconds    Comment: Performed at [facility], [address] [place], [place] [address]  Troponin I (High Sensitivity)     Status: Abnormal   Collection Time: 12/28/22  1:26 PM  Result Value Ref Range   Troponin I (High Sensitivity) 253 (HH) <18 ng/L    Comment: CRITICAL VALUE NOTED. VALUE IS CONSISTENT WITH PREVIOUSLY REPORTED/CALLED VALUE MW (NOTE) Elevated high sensitivity troponin I (hsTnI) values and significant  changes across serial measurements may suggest ACS but many other  chronic and acute conditions are known to elevate hsTnI results.  Refer to the "Links" section for chest pain algorithms and additional  guidance. Performed at [facility], [address] [place], [place] [address]   Heparin level (unfractionated)     Status: Abnormal   Collection Time: 12/28/22  8:16 PM  Result Value Ref Range   Heparin Unfractionated <0.10 (L) 0.30 - 0.70 IU/mL    Comment: (NOTE) The clinical reportable range upper limit is being lowered  to >1.10 to align with the FDA approved guidance for the current laboratory assay.  If heparin results are below expected values, and patient dosage has  been confirmed, suggest follow up testing of antithrombin III levels. Performed at [facility], [address] [place], [place] [address]   Troponin I (High Sensitivity)     Status: Abnormal   Collection Time: 12/28/22  8:16 PM  Result Value Ref Range   Troponin I (High Sensitivity) 145 (HH) <18 ng/L    Comment: CRITICAL VALUE NOTED. VALUE IS CONSISTENT WITH PREVIOUSLY REPORTED/CALLED VALUE SKL (NOTE) Elevated high sensitivity troponin I (hsTnI) values and significant  changes across serial measurements may suggest ACS but many other  chronic and acute conditions are known to elevate hsTnI results.  Refer to the "Links" section for chest pain algorithms and additional  guidance. Performed at [facility], [address] [place], [place]  [address]   CBC     Status: Abnormal   Collection Time: 12/29/22  5:51 AM  Result Value Ref Range   WBC 8.2 4.0 - 10.5 K/uL   RBC 3.85 (L) 3.87 - 5.11 MIL/uL   Hemoglobin 11.5 (L) 12.0 - 15.0 g/dL   HCT 09.8 (L) 11.9 - 14.7 %   MCV 87.8 80.0 - 100.0 fL   MCH 29.9 26.0 - 34.0 pg   MCHC 34.0 30.0 - 36.0 g/dL   RDW 82.9 56.2 - 13.0 %   Platelets 212 150 - 400 K/uL   nRBC 0.0 0.0 - 0.2 %    Comment: Performed at [facility], [address] [place], [place] [address]  Comprehensive metabolic panel     Status: Abnormal   Collection Time: 12/29/22  5:51 AM  Result Value Ref Range   Sodium 136 135 - 145 mmol/L   Potassium 3.9 3.5 - 5.1 mmol/L   Chloride 107 98 - 111 mmol/L   CO2 19 (L) 22 - 32 mmol/L   Glucose, Bld 113 (H) 70 - 99 mg/dL    Comment: Glucose reference range applies only to samples taken after fasting for at least 8 hours.   BUN 15 6 - 20 mg/dL   Creatinine, Ser 4.69 0.44 - 1.00 mg/dL   Calcium 8.5 (L) 8.9 - 10.3 mg/dL   Total Protein 6.2 (L) 6.5 - 8.1 g/dL   Albumin 3.3 (L) 3.5 - 5.0 g/dL   AST 22 15 - 41 U/L   ALT 24 0 - 44 U/L   Alkaline Phosphatase 66 38 - 126 U/L   Total Bilirubin 0.4 <1.2 mg/dL   GFR, Estimated >62 >95 mL/min    Comment: (NOTE) Calculated using the CKD-EPI Creatinine Equation (2021)    Anion gap 10 5 - 15    Comment: Performed at [facility], [address] [place], [place] [address]  HIV Antibody (routine testing w rflx)     Status: None   Collection Time: 12/29/22  5:51 AM  Result Value Ref Range   HIV Screen 4th Generation wRfx Non Reactive Non Reactive    Comment: Performed at [facility], [address] [place], [place] [address]  Heparin level (unfractionated)     Status: Abnormal   Collection Time: 12/29/22  5:51 AM  Result Value Ref Range   Heparin Unfractionated 0.25 (L) 0.30 - 0.70 IU/mL    Comment: (NOTE) The clinical reportable range upper limit is being lowered to >1.10 to align with the FDA  approved guidance for the current laboratory  assay.  If heparin results are below expected values, and patient dosage has  been confirmed, suggest follow up testing of antithrombin III levels. Performed at [facility], [address] [place], [place] [address]   Heparin level (unfractionated)     Status: Abnormal   Collection Time: 12/29/22  1:08 PM  Result Value Ref Range   Heparin Unfractionated 0.21 (L) 0.30 - 0.70 IU/mL    Comment: (NOTE) The clinical reportable range upper limit is being lowered to >1.10 to align with the FDA approved guidance for the current laboratory assay.  If heparin results are below expected values, and patient dosage has  been confirmed, suggest follow up testing of antithrombin III levels. Performed at [facility], [address] [place], [place] [address]   Heparin level (unfractionated)     Status: Abnormal   Collection Time: 12/29/22  9:08 PM  Result Value Ref Range   Heparin Unfractionated 0.28 (L) 0.30 - 0.70 IU/mL    Comment: (NOTE) The clinical reportable range upper limit is being lowered to >1.10 to align with the FDA approved guidance for the current laboratory assay.  If heparin results are below expected values, and patient dosage has  been confirmed, suggest follow up testing of antithrombin III levels. Performed at [facility], [address] [place], [place] [address]   CBC     Status: Abnormal   Collection Time: 12/30/22  4:28 AM  Result Value Ref Range   WBC 7.9 4.0 - 10.5 K/uL   RBC 3.52 (L) 3.87 - 5.11 MIL/uL   Hemoglobin 10.2 (L) 12.0 - 15.0 g/dL   HCT 27.0 (L) 35.0 - 09.3 %   MCV 86.9 80.0 - 100.0 fL   MCH 29.0 26.0 - 34.0 pg   MCHC 33.3 30.0 - 36.0 g/dL   RDW 81.8 29.9 - 37.1 %   Platelets 200 150 - 400 K/uL   nRBC 0.0 0.0 - 0.2 %    Comment: Performed at [facility], [address] [place], [place] [address]  Basic metabolic panel     Status: Abnormal   Collection  Time: 12/30/22  4:28 AM  Result Value Ref Range   Sodium 137 135 - 145 mmol/L   Potassium 3.9 3.5 - 5.1 mmol/L   Chloride 109 98 - 111 mmol/L   CO2 21 (L) 22 - 32 mmol/L   Glucose, Bld 110 (H) 70 - 99 mg/dL    Comment: Glucose reference range applies only to samples taken after fasting for at least 8 hours.   BUN 16 6 - 20 mg/dL   Creatinine, Ser 9.38 0.44 - 1.00 mg/dL   Calcium 8.3 (L) 8.9 - 10.3 mg/dL   GFR, Estimated >10 >17 mL/min    Comment: (NOTE) Calculated using the CKD-EPI Creatinine Equation (2021)    Anion gap 7 5 - 15    Comment: Performed at [facility], [address] [place], [place] [address]  Heparin level (unfractionated)     Status: None   Collection Time: 12/30/22  4:28 AM  Result Value Ref Range   Heparin Unfractionated 0.47 0.30 - 0.70 IU/mL    Comment: (NOTE) The clinical reportable range upper limit is being lowered to >1.10 to align with the FDA approved guidance for the current laboratory assay.  If heparin results are below expected values, and patient dosage has  been confirmed, suggest follow up testing of antithrombin III levels. Performed at [facility], [address] [place],  [place] [address]   ECHOCARDIOGRAM COMPLETE     Status: None   Collection Time: 12/30/22 10:20 AM  Result Value Ref Range   Weight 3,199.32 oz   Height 65 in   BP 117/74 mmHg   Ao pk vel 1.53 m/s   AV Area VTI 2.77 cm2   AR max vel 2.65 cm2   AV Mean grad 4.0 mmHg   AV Peak grad 9.4 mmHg   S' Lateral 2.40 cm   AV Area mean vel 2.48 cm2   Area-P 1/2 3.65 cm2   MV VTI 3.14 cm2   Est EF 60 - 65%     Radiology ECHOCARDIOGRAM COMPLETE  Result Date: 12/30/2022    ECHOCARDIOGRAM REPORT   Patient Name:   Lonie Peak Date of Exam: 12/30/2022 Medical Rec #:  [id number]               Height:       65.0 in Accession #:    [id number]              Weight:       200.0 lb Date of Birth:  June 07, 1975              BSA:          1.978 m Patient  Age:    46 years                BP:           117/74 mmHg Patient Gender: F                       HR:           68 bpm. Exam Location:  [facility] Procedure: 2D Echo, Cardiac Doppler and Color Doppler Indications:     NSTEMI  History:         Patient has no prior history of Echocardiogram examinations.                  Acute MI. Pulmonary embolus.  Sonographer:     Mikki Harbor Referring Phys:  [id number] Marlow Baars Kataleah Bejar Diagnosing Phys: Julien Nordmann MD IMPRESSIONS  1. Left ventricular ejection fraction, by estimation, is 60 to 65%. The left ventricle has normal function. The left ventricle has no regional wall motion abnormalities. Left ventricular diastolic parameters were normal.  2. Right ventricular systolic function is low normal. The right ventricular size is moderately enlarged. There is normal pulmonary artery systolic pressure. The estimated right ventricular systolic pressure is 27.2 mmHg.  3. The mitral valve is normal in structure. Mild mitral valve regurgitation. No evidence of mitral stenosis.  4. Tricuspid valve regurgitation is mild to moderate.  5. The aortic valve is tricuspid. Aortic valve regurgitation is not visualized. No aortic stenosis is present.  6. The inferior vena cava is normal in size with greater than 50% respiratory variability, suggesting right atrial pressure of 3 mmHg. FINDINGS  Left Ventricle: Left ventricular ejection fraction, by estimation, is 60 to 65%. The left ventricle has normal function. The left ventricle has no regional wall motion abnormalities. The left ventricular internal cavity size was normal in size. There is  no left ventricular hypertrophy. Left ventricular diastolic parameters were normal. Right Ventricle: The right ventricular size is moderately enlarged. No increase in right ventricular wall thickness. Right ventricular systolic function is low normal. There is normal pulmonary artery systolic pressure. The tricuspid regurgitant velocity  is 2.19 m/s, and with an  assumed right atrial pressure of 8 mmHg, the estimated right ventricular systolic pressure is 27.2 mmHg. Left Atrium: Left atrial size was normal in size. Right Atrium: Right atrial size was normal in size. Pericardium: There is no evidence of pericardial effusion. Mitral Valve: The mitral valve is normal in structure. Mild mitral valve regurgitation. No evidence of mitral valve stenosis. MV peak gradient, 3.5 mmHg. The mean mitral valve gradient is 2.0 mmHg. Tricuspid Valve: The tricuspid valve is normal in structure. Tricuspid valve regurgitation is mild to moderate. No evidence of tricuspid stenosis. Aortic Valve: The aortic valve is tricuspid. Aortic valve regurgitation is not visualized. No aortic stenosis is present. Aortic valve mean gradient measures 4.0 mmHg. Aortic valve peak gradient measures 9.4 mmHg. Aortic valve area, by VTI measures 2.77 cm. Pulmonic Valve: The pulmonic valve was normal in structure. Pulmonic valve regurgitation is not visualized. No evidence of pulmonic stenosis. Aorta: The aortic root is normal in size and structure. Venous: The inferior vena cava is normal in size with greater than 50% respiratory variability, suggesting right atrial pressure of 3 mmHg. IAS/Shunts: No atrial level shunt detected by color flow Doppler.  LEFT VENTRICLE PLAX 2D LVIDd:         4.70 cm   Diastology LVIDs:         2.40 cm   LV e' medial:    11.30 cm/s LV PW:         0.60 cm   LV E/e' medial:  7.9 LV IVS:        1.10 cm   LV e' lateral:   14.40 cm/s LVOT diam:     2.00 cm   LV E/e' lateral: 6.2 LV SV:         80 LV SV Index:   41 LVOT Area:     3.14 cm  RIGHT VENTRICLE RV Basal diam:  4.30 cm RV Mid diam:    3.20 cm RV S prime:     11.60 cm/s TAPSE (M-mode): 2.4 cm LEFT ATRIUM             Index        RIGHT ATRIUM           Index LA diam:        3.80 cm 1.92 cm/m   RA Area:     17.90 cm LA Vol (A2C):   59.1 ml 29.88 ml/m  RA Volume:   53.80 ml  27.20 ml/m LA Vol (A4C):   49.5 ml 25.03 ml/m LA  Biplane Vol: 58.3 ml 29.47 ml/m  AORTIC VALVE                    PULMONIC VALVE AV Area (Vmax):    2.65 cm     PV Vmax:       0.94 m/s AV Area (Vmean):   2.48 cm     PV Peak grad:  3.5 mmHg AV Area (VTI):     2.77 cm AV Vmax:           153.00 cm/s AV Vmean:          88.700 cm/s AV VTI:            0.289 m AV Peak Grad:      9.4 mmHg AV Mean Grad:      4.0 mmHg LVOT Vmax:         129.00 cm/s LVOT Vmean:        70.000 cm/s LVOT VTI:  0.255 m LVOT/AV VTI ratio: 0.88  AORTA Ao Root diam: 3.20 cm MITRAL VALVE               TRICUSPID VALVE MV Area (PHT): 3.65 cm    TR Peak grad:   19.2 mmHg MV Area VTI:   3.14 cm    TR Vmax:        219.00 cm/s MV Peak grad:  3.5 mmHg MV Mean grad:  2.0 mmHg    SHUNTS MV Vmax:       0.94 m/s    Systemic VTI:  0.26 m MV Vmean:      64.0 cm/s   Systemic Diam: 2.00 cm MV Decel Time: 208 msec MV E velocity: 89.60 cm/s MV A velocity: 84.90 cm/s MV E/A ratio:  1.06 Julien Nordmann MD Electronically signed by Julien Nordmann MD Signature Date/Time: 12/30/2022/4:05:53 PM    Final    PERIPHERAL VASCULAR CATHETERIZATION  Result Date: 12/29/2022 See surgical note for result.  US Venous Img Lower Bilateral (DVT)  Result Date: 12/28/2022 CLINICAL DATA:  Pulmonary embolism EXAM: BILATERAL LOWER EXTREMITY VENOUS DOPPLER ULTRASOUND TECHNIQUE: Gray-scale sonography with graded compression, as well as color Doppler and duplex ultrasound were performed to evaluate the lower extremity deep venous systems from the level of the common femoral vein and including the common femoral, femoral, profunda femoral, popliteal and calf veins including the posterior tibial, peroneal and gastrocnemius veins when visible. The superficial great saphenous vein was also interrogated. Spectral Doppler was utilized to evaluate flow at rest and with distal augmentation maneuvers in the common femoral, femoral and popliteal veins. COMPARISON:  None available FINDINGS: RIGHT LOWER EXTREMITY Common Femoral Vein:  No evidence of thrombus. Normal compressibility, respiratory phasicity and response to augmentation. Saphenofemoral Junction: No evidence of thrombus. Normal compressibility and flow on color Doppler imaging. Profunda Femoral Vein: No evidence of thrombus. Normal compressibility and flow on color Doppler imaging. Femoral Vein: No evidence of thrombus. Normal compressibility, respiratory phasicity and response to augmentation. Popliteal Vein: No evidence of thrombus. Normal compressibility, respiratory phasicity and response to augmentation. Calf Veins: No evidence of thrombus. Normal compressibility and flow on color Doppler imaging. Superficial Great Saphenous Vein: No evidence of thrombus. Normal compressibility. Venous Reflux:  None. Other Findings:  None. LEFT LOWER EXTREMITY Common Femoral Vein: No evidence of thrombus. Normal compressibility, respiratory phasicity and response to augmentation. Saphenofemoral Junction: No evidence of thrombus. Normal compressibility and flow on color Doppler imaging. Profunda Femoral Vein: No evidence of thrombus. Normal compressibility and flow on color Doppler imaging. Femoral Vein: Thrombosed. Popliteal Vein: Thrombosed. Calf Veins: Thrombosed. Superficial Great Saphenous Vein: No evidence of thrombus. Normal compressibility. Venous Reflux:  None. Other Findings:  None. IMPRESSION: Acute occlusive DVT of the left femoral, popliteal, and calf veins. Electronically Signed   By: Acquanetta Belling M.D.   On: 12/28/2022 18:18   DG Chest 2 View  Result Date: 12/28/2022 CLINICAL DATA:  Left-sided chest pain and shortness of breath. EXAM: CHEST - 2 VIEW COMPARISON:  None Available. FINDINGS: The heart size and mediastinal contours are within normal limits. Both lungs are clear. The visualized skeletal structures are unremarkable. IMPRESSION: No active cardiopulmonary disease. Electronically Signed   By: Danae Orleans M.D.   On: 12/28/2022 14:52   CT Angio Chest PE W and/or Wo  Contrast  Result Date: 12/28/2022 CLINICAL DATA:  Shortness of breath, elevated heart rate EXAM: CT ANGIOGRAPHY CHEST WITH CONTRAST TECHNIQUE: Multidetector CT imaging of the chest was performed using the standard protocol  during bolus administration of intravenous contrast. Multiplanar CT image reconstructions and MIPs were obtained to evaluate the vascular anatomy. RADIATION DOSE REDUCTION: This exam was performed according to the departmental dose-optimization program which includes automated exposure control, adjustment of the mA and/or kV according to patient size and/or use of iterative reconstruction technique. CONTRAST:  75mL OMNIPAQUE IOHEXOL 350 MG/ML SOLN COMPARISON:  None Available. FINDINGS: Cardiovascular: Acute pulmonary embolus seen in the bilateral distal right and left pulmonary arteries extending into the lobar arteries and bilateral segmental and subsegmental branches. Elevated RV to LV ratio of 1.9. Normal overall heart size. No pericardial effusion. No coronary artery calcifications. Normal caliber thoracic aorta with no significant atherosclerotic disease. Mediastinum/Nodes: Esophagus and thyroid are unremarkable. No enlarged lymph nodes seen in the chest. Lungs/Pleura: central airways are patent. No consolidation, pleural effusion or pneumothorax. Upper Abdomen: No acute abnormality. Musculoskeletal: No chest wall abnormality. No acute or significant osseous findings. Review of the MIP images confirms the above findings. IMPRESSION: 1. Acute pulmonary embolus seen in the bilateral distal right and left pulmonary arteries extending into the lobar arteries and bilateral segmental and subsegmental branches. 2. Elevated RV to LV ratio of 1.9, consistent with right heart strain. Provider Dr.PATRICK ROBINSON, called PRA line to request STAT read and is aware of positive result. Electronically Signed   By: Allegra Lai M.D.   On: 12/28/2022 14:47    Assessment/Plan  Pulmonary emboli  [facility]) Status post pulmonary thrombectomy and doing well.  Would continue full dose anticoagulation for 1 year and then we can discuss whether or not to go to a prophylactic dose.  Compression socks and elevation to avoid postphlebitic symptoms.  Follow-up in 1 year.  Acute deep vein thrombosis (DVT) of femoral vein of left lower extremity (HCC) Symptoms are currently quite mild on anticoagulation.  This was the source for PE.  Anticoagulation which we will continue for least 1 year.    Festus Barren, MD  01/07/2023 4:25 PM    This note was created with Dragon medical transcription system.  Any errors from dictation are purely unintentional

## 2023-01-06 ENCOUNTER — Encounter: Payer: Self-pay | Admitting: Obstetrics and Gynecology

## 2023-01-07 ENCOUNTER — Encounter: Payer: Self-pay | Admitting: Family Medicine

## 2023-01-07 ENCOUNTER — Ambulatory Visit: Payer: No Typology Code available for payment source | Admitting: Family Medicine

## 2023-01-07 VITALS — BP 118/78 | HR 88 | Ht 65.0 in | Wt 218.0 lb

## 2023-01-07 DIAGNOSIS — Z8349 Family history of other endocrine, nutritional and metabolic diseases: Secondary | ICD-10-CM

## 2023-01-07 DIAGNOSIS — N879 Dysplasia of cervix uteri, unspecified: Secondary | ICD-10-CM | POA: Insufficient documentation

## 2023-01-07 DIAGNOSIS — Z131 Encounter for screening for diabetes mellitus: Secondary | ICD-10-CM | POA: Diagnosis not present

## 2023-01-07 DIAGNOSIS — I2609 Other pulmonary embolism with acute cor pulmonale: Secondary | ICD-10-CM | POA: Diagnosis not present

## 2023-01-07 DIAGNOSIS — K219 Gastro-esophageal reflux disease without esophagitis: Secondary | ICD-10-CM | POA: Insufficient documentation

## 2023-01-07 DIAGNOSIS — E785 Hyperlipidemia, unspecified: Secondary | ICD-10-CM | POA: Diagnosis not present

## 2023-01-07 DIAGNOSIS — I82412 Acute embolism and thrombosis of left femoral vein: Secondary | ICD-10-CM

## 2023-01-07 DIAGNOSIS — Z0000 Encounter for general adult medical examination without abnormal findings: Secondary | ICD-10-CM

## 2023-01-07 DIAGNOSIS — R7303 Prediabetes: Secondary | ICD-10-CM

## 2023-01-07 DIAGNOSIS — Z1159 Encounter for screening for other viral diseases: Secondary | ICD-10-CM

## 2023-01-07 DIAGNOSIS — Z23 Encounter for immunization: Secondary | ICD-10-CM | POA: Diagnosis not present

## 2023-01-07 DIAGNOSIS — Z1211 Encounter for screening for malignant neoplasm of colon: Secondary | ICD-10-CM

## 2023-01-07 MED ORDER — OMEPRAZOLE 20 MG PO CPDR: 20.0000 mg | DELAYED_RELEASE_CAPSULE | Freq: Every day | ORAL | 3 refills | Status: DC

## 2023-01-07 MED ORDER — APIXABAN 5 MG PO TABS: 5.0000 mg | ORAL_TABLET | Freq: Two times a day (BID) | ORAL | 2 refills | Status: DC

## 2023-01-07 NOTE — Assessment & Plan Note (Signed)
The patient also mentions a history of reflux, which is managed daily with Prilosec. The patient's reflux symptoms tend to flare up in the afternoon, especially if the patient does not take Prilosec.  Gastroesophageal Reflux Disease (GERD) Daily symptoms controlled with over-the-counter Prilosec. -Prescribe Omeprazole 20mg  daily for 90 days with 3 refills.

## 2023-01-07 NOTE — Assessment & Plan Note (Signed)
Hyperlipidemia Previous cholesterol levels were elevated. -Order fasting lipid panel.

## 2023-01-07 NOTE — Assessment & Plan Note (Signed)
Pre-diabetes Previous A1C was in the pre-diabetes range. -Order A1C.

## 2023-01-07 NOTE — Patient Instructions (Signed)
-   Obtain fasting labs with orders provided (can have water or black coffee but otherwise no food or drink x 8 hours before labs) - Review information provided - Attend eye doctor annually, dentist every 6 months, work towards or maintain 30 minutes of moderate intensity physical activity at least 5 days per week, and consume a balanced diet - Return in 1 year for physical - Contact us for any questions between now and then  Additionally:  -PE & DEEP VEIN THROMBOSIS (DVT): You will continue taking Eliquis and we have referred you to a hematologist for further evaluation and management.  -GASTROESOPHAGEAL REFLUX DISEASE (GERD): We have prescribed Omeprazole 20mg  daily for 90 days with 3 refills to manage your symptoms.  -HYPERLIPIDEMIA: We will order a fasting lipid panel to check your cholesterol levels.  -PRE-DIABETES: We will order an A1C test to monitor your blood sugar levels.  -GENERAL HEALTH MAINTENANCE: We will order a colonoscopy for colorectal cancer screening and advise you to continue with cervical cancer screening through your GYN.

## 2023-01-07 NOTE — Assessment & Plan Note (Signed)
Symptoms are currently quite mild on anticoagulation.  This was the source for PE.  Anticoagulation which we will continue for least 1 year.

## 2023-01-07 NOTE — Assessment & Plan Note (Addendum)
History of Present Illness The patient, with a recent history of pulmonary embolism and deep vein thrombosis (DVT), presents for establishing care. The patient reports significant improvement in heart rate and breathing since starting on blood thinner Eliquis.  The patient is currently wearing compression socks for a DVT in the leg.  PE & Deep Vein Thrombosis (DVT)  On Eliquis 10mg  daily. One remaining DVT in the left leg. Likely caused by immobilization and hormonal birth control, both of which have been addressed. -Continue Eliquis 10mg  daily. -Referral to hematology for further evaluation and management of possible underlying hematologic risk factors.

## 2023-01-07 NOTE — Assessment & Plan Note (Signed)
Status post pulmonary thrombectomy and doing well.  Would continue full dose anticoagulation for 1 year and then we can discuss whether or not to go to a prophylactic dose.  Compression socks and elevation to avoid postphlebitic symptoms.  Follow-up in 1 year.

## 2023-01-07 NOTE — Progress Notes (Signed)
Annual Physical Exam Visit  Patient Information:  Patient ID: Jenna Mccarty, female DOB: [birth year] Age: 47 y.o. MRN: [id number]   Subjective:   CC: Annual Physical Exam  HPI:  Jenna Mccarty is here for their annual physical.  I reviewed the past medical history, family history, social history, surgical history, and allergies today and changes were made as necessary.  Please see the problem list section below for additional details.  Past Medical History: Past Medical History:  Diagnosis Date   Cervical dysplasia    Hx of dysplastic nevus ~2018   L med ankle, txted by Dr. Cheree Ditto   Hx of dysplastic nevus ~2018   L vulva, txted by Dr. Cheree Ditto   Hx of dysplastic nevus    L upper abdomen, txed by Dr. Cheree Ditto   Past Surgical History: Past Surgical History:  Procedure Laterality Date   COLPOSCOPY  2002   KNEE SURGERY Left 1996   ACL reconstruction - patella tendon graft   PULMONARY THROMBECTOMY N/A 12/29/2022   Procedure: PULMONARY THROMBECTOMY;  Surgeon: Annice Needy, MD;  Location: [facility] INVASIVE CV LAB;  Service: Cardiovascular;  Laterality: N/A;   TONSILLECTOMY     WISDOM TOOTH EXTRACTION     Family History: Family History  Problem Relation Age of Onset   Diabetes Mellitus II Mother    Other Mother 27       Pituitary Mass-benign   Thyroid disease Sister        on Synthroid   Diabetes Mellitus II Sister    Diabetes Mellitus II Maternal Grandfather    Other Maternal Grandfather        Pituitary Mass-benign   Diabetes Mellitus II Paternal Grandmother    Deep vein thrombosis Paternal Grandmother        lifetime anticoagulation   Hypothyroidism Paternal Grandmother    Breast cancer Paternal Aunt        pat great aunt   Cancer Other 64       Breast   Allergies: Allergies  Allergen Reactions   Penicillins Hives   Health Maintenance: Health Maintenance  Topic Date Due   COVID-19 Vaccine (1) Never done   Hepatitis C Screening  Never done    INFLUENZA VACCINE  09/17/2022   Colonoscopy  Never done   Cervical Cancer Screening (HPV/Pap Cotest)  05/07/2027   DTaP/Tdap/Td (2 - Td or Tdap) 01/06/2033   HIV Screening  Completed   HPV VACCINES  Aged Out    HM Colonoscopy          Overdue - Colonoscopy (Every 10 Years) Never done    No completion history exists for this topic.           Medications: Current Outpatient Medications on File Prior to Visit  Medication Sig Dispense Refill   cetirizine (ZYRTEC) 10 MG tablet Take by mouth.     Multiple Vitamins-Minerals (MULTIVITAMIN PO) Take by mouth.     No current facility-administered medications on file prior to visit.    Objective:   Vitals:   01/07/23 1338  BP: 118/78  Pulse: 88  SpO2: 97%   Vitals:   01/07/23 1338  Weight: 218 lb (98.9 kg)  Height: 5\' 5"  (1.651 m)   Body mass index is 36.28 kg/m.  General: Well Developed, well nourished, and in no acute distress.  Neuro: Alert and oriented x3, extra-ocular muscles intact, sensation grossly intact. Cranial nerves II through XII are grossly intact, motor, sensory, and coordinative functions are intact.  HEENT: Normocephalic, atraumatic, neck supple, no masses, no lymphadenopathy, thyroid nonenlarged. Oropharynx, nasopharynx, external ear canals are unremarkable. Skin: Warm and dry, no rashes noted.  Cardiac: Regular rate and rhythm, no murmurs rubs or gallops. No peripheral edema. Pulses symmetric. Respiratory: Clear to auscultation bilaterally. Speaking in full sentences.  Abdominal: Soft, nontender, nondistended, positive bowel sounds, no masses, no organomegaly. Musculoskeletal: Stable, and with full range of motion.  Indication: Cerumen impaction of the ear(s)  Medical necessity statement: On physical examination, cerumen impairs clinically significant portions of the external auditory canal, and tympanic membrane. Noted obstructive, copious cerumen that cannot be removed without magnification and  instrumentations Consent: Discussed benefits and risks of procedure and verbal consent obtained Procedure: Patient was prepped for the procedure. Utilized an otoscope to assess and take note of the ear canal, the tympanic membrane, and the presence, amount, and placement of the cerumen. Gentle water irrigation was utilized to remove cerumen.  Post procedure examination: shows cerumen was completely removed. Patient tolerated procedure well. The patient is made aware that they may experience temporary vertigo, temporary hearing loss, and temporary discomfort. If these symptom last for more than 24 hours to call the clinic or proceed to the ED.  Impression and Recommendations:   The patient was counselled, risk factors were discussed, and anticipatory guidance given.  Problem List Items Addressed This Visit       Cardiovascular and Mediastinum   Pulmonary emboli (HCC)    History of Present Illness The patient, with a recent history of pulmonary embolism and deep vein thrombosis (DVT), presents for establishing care. The patient reports significant improvement in heart rate and breathing since starting on blood thinner Eliquis.  The patient is currently wearing compression socks for a DVT in the leg.  PE & Deep Vein Thrombosis (DVT)  On Eliquis 10mg  daily. One remaining DVT in the left leg. Likely caused by immobilization and hormonal birth control, both of which have been addressed. -Continue Eliquis 10mg  daily. -Referral to hematology for further evaluation and management of possible underlying hematologic risk factors.      Relevant Medications   apixaban (ELIQUIS) 5 MG TABS tablet   Other Relevant Orders   Ambulatory referral to Hematology / Oncology   Acute deep vein thrombosis (DVT) of femoral vein of left lower extremity (HCC)   Relevant Medications   apixaban (ELIQUIS) 5 MG TABS tablet   Other Relevant Orders   Ambulatory referral to Hematology / Oncology     Digestive    Gastroesophageal reflux disease without esophagitis    The patient also mentions a history of reflux, which is managed daily with Prilosec. The patient's reflux symptoms tend to flare up in the afternoon, especially if the patient does not take Prilosec.  Gastroesophageal Reflux Disease (GERD) Daily symptoms controlled with over-the-counter Prilosec. -Prescribe Omeprazole 20mg  daily for 90 days with 3 refills.      Relevant Medications   omeprazole (PRILOSEC) 20 MG capsule     Genitourinary   Cervical dysplasia     Other   Prediabetes    Pre-diabetes Previous A1C was in the pre-diabetes range. -Order A1C.      Relevant Orders   Hemoglobin A1c   Hyperlipidemia    Hyperlipidemia Previous cholesterol levels were elevated. -Order fasting lipid panel.      Relevant Medications   apixaban (ELIQUIS) 5 MG TABS tablet   Other Relevant Orders   Comprehensive metabolic panel   Lipid panel   Healthcare maintenance - Primary   Relevant  Orders   CBC   Comprehensive metabolic panel   Hemoglobin A1c   Lipid panel   TSH   Hepatitis C antibody   Other Visit Diagnoses     Screening for diabetes mellitus       Relevant Orders   Hemoglobin A1c   Need for hepatitis C screening test       Relevant Orders   Hepatitis C antibody   Family history of thyroid disease       Relevant Orders   TSH   Colon cancer screening       Relevant Orders   Ambulatory referral to Gastroenterology        Orders & Medications Medications:  Meds ordered this encounter  Medications   apixaban (ELIQUIS) 5 MG TABS tablet    Sig: Take 1 tablet (5 mg total) by mouth 2 (two) times daily.    Dispense:  60 tablet    Refill:  2   omeprazole (PRILOSEC) 20 MG capsule    Sig: Take 1 capsule (20 mg total) by mouth daily.    Dispense:  90 capsule    Refill:  3   Orders Placed This Encounter  Procedures   Tdap vaccine greater than or equal to 7yo IM   CBC   Comprehensive metabolic panel    Hemoglobin A1c   Lipid panel   TSH   Hepatitis C antibody   Ambulatory referral to Hematology / Oncology   Ambulatory referral to Gastroenterology     Return in about 1 year (around 01/07/2024) for CPE.    Jerrol Banana, MD, [facility]   Primary Care Sports Medicine Primary Care and Sports Medicine at [facility]

## 2023-01-12 ENCOUNTER — Telehealth: Payer: Self-pay

## 2023-01-12 NOTE — Telephone Encounter (Signed)
Patient called in to schedule colonoscopy.

## 2023-01-12 NOTE — Telephone Encounter (Signed)
Patient just recently started taking Eliquis due to PE.  It's too soon for it to be stopped to schedule her colonoscopy.  Referral closed.

## 2023-01-18 ENCOUNTER — Inpatient Hospital Stay: Payer: No Typology Code available for payment source

## 2023-01-18 ENCOUNTER — Encounter: Payer: Self-pay | Admitting: Oncology

## 2023-01-18 ENCOUNTER — Inpatient Hospital Stay: Payer: No Typology Code available for payment source | Attending: Oncology | Admitting: Oncology

## 2023-01-18 VITALS — BP 125/66 | HR 74 | Temp 97.7°F | Resp 18 | Ht 65.0 in | Wt 218.8 lb

## 2023-01-18 DIAGNOSIS — I82412 Acute embolism and thrombosis of left femoral vein: Secondary | ICD-10-CM | POA: Diagnosis not present

## 2023-01-18 DIAGNOSIS — D509 Iron deficiency anemia, unspecified: Secondary | ICD-10-CM | POA: Insufficient documentation

## 2023-01-18 DIAGNOSIS — I82432 Acute embolism and thrombosis of left popliteal vein: Secondary | ICD-10-CM | POA: Insufficient documentation

## 2023-01-18 DIAGNOSIS — I2699 Other pulmonary embolism without acute cor pulmonale: Secondary | ICD-10-CM

## 2023-01-18 DIAGNOSIS — I2694 Multiple subsegmental pulmonary emboli without acute cor pulmonale: Secondary | ICD-10-CM | POA: Diagnosis present

## 2023-01-18 DIAGNOSIS — D6851 Activated protein C resistance: Secondary | ICD-10-CM | POA: Diagnosis not present

## 2023-01-18 DIAGNOSIS — Z7901 Long term (current) use of anticoagulants: Secondary | ICD-10-CM | POA: Insufficient documentation

## 2023-01-18 DIAGNOSIS — I519 Heart disease, unspecified: Secondary | ICD-10-CM | POA: Diagnosis not present

## 2023-01-18 NOTE — Progress Notes (Signed)
Hematology/Oncology Consult note [facility] Telephone:([phone] [phone]  Patient Care Team: Jerrol Banana, MD as PCP - General (Family Medicine) Creig Hines, MD as Consulting Physician (Oncology)   Name of the patient: Jenna Mccarty  [id number]  1975/12/12    Reason for referral-acute left lower extremity DVT and bilateral pulmonary embolism   Referring physician-Dr. Joseph Berkshire, MD  Date of visit: 01/18/23   History of presenting illness-patient is a 47 year old premenopausal female who has been on birth control pills since she was 47.  She has experienced occasional cramping pain in her left leg since August 2024.  Over the last 2 weeks she started having more pain in her left leg as well as exertional shortness of breath and tachycardia and therefore went to the ER Bilateral lower extremity ultrasound showed acute occlusive DVT of the left femoral popliteal and calf veins.  CT angio chest showed bilateral distal right and left pulmonary artery embolism into the lobar arteries and segmental and subsegmental branches with right heart strain.  Echocardiogram showed right ventricular enlargement and mild to moderate tricuspid regurgitation.  She underwent thrombectomy of the pulmonary embolism by vascular surgery.  She has been on Eliquis since then.  No prior history of DVT or PE.  No significant family history of thrombosis.  No recent surgery or trauma.  Appetite and weight have remained stable.  She is up-to-date with her mammograms  ECOG PS- 0  Pain scale- 0   Review of systems- Review of Systems  Constitutional:  Negative for chills, fever, malaise/fatigue and weight loss.  HENT:  Negative for congestion, ear discharge and nosebleeds.   Eyes:  Negative for blurred vision.  Respiratory:  Negative for cough, hemoptysis, sputum production, shortness of breath and wheezing.   Cardiovascular:  Negative for chest pain,  palpitations, orthopnea and claudication.  Gastrointestinal:  Negative for abdominal pain, blood in stool, constipation, diarrhea, heartburn, melena, nausea and vomiting.  Genitourinary:  Negative for dysuria, flank pain, frequency, hematuria and urgency.  Musculoskeletal:  Negative for back pain, joint pain and myalgias.  Skin:  Negative for rash.  Neurological:  Negative for dizziness, tingling, focal weakness, seizures, weakness and headaches.  Endo/Heme/Allergies:  Does not bruise/bleed easily.  Psychiatric/Behavioral:  Negative for depression and suicidal ideas. The patient does not have insomnia.     Allergies  Allergen Reactions   Penicillins Hives    Patient Active Problem List   Diagnosis Date Noted   Healthcare maintenance 01/07/2023   Gastroesophageal reflux disease without esophagitis 01/07/2023   Prediabetes 01/07/2023   Hyperlipidemia 01/07/2023   Cervical dysplasia    Acute deep vein thrombosis (DVT) of femoral vein of left lower extremity (HCC) 12/29/2022   Myocardial injury 12/29/2022   Obesity (BMI 30-39.9) 12/29/2022   Pulmonary emboli (HCC) 12/28/2022   Contraception management 12/28/2022   Exposure to severe acute respiratory syndrome coronavirus 2 (SARS-CoV-2) 07/27/2019     Past Medical History:  Diagnosis Date   Cervical dysplasia    GERD (gastroesophageal reflux disease)    Hx of dysplastic nevus ~2018   L med ankle, txted by Dr. Cheree Ditto   Hx of dysplastic nevus ~2018   L vulva, txted by Dr. Cheree Ditto   Hx of dysplastic nevus    L upper abdomen, txed by Dr. Cheree Ditto     Past Surgical History:  Procedure Laterality Date   COLPOSCOPY  2002   KNEE SURGERY Left 1996   ACL reconstruction - patella tendon graft  PULMONARY THROMBECTOMY N/A 12/29/2022   Procedure: PULMONARY THROMBECTOMY;  Surgeon: Annice Needy, MD;  Location: [facility] INVASIVE CV LAB;  Service: Cardiovascular;  Laterality: N/A;   TONSILLECTOMY     WISDOM TOOTH EXTRACTION      Social  History   Socioeconomic History   Marital status: Married    Spouse name: Not on file   Number of children: 0   Years of education: Not on file   Highest education level: Bachelor's degree (e.g., BA, AB, BS)  Occupational History   Not on file  Tobacco Use   Smoking status: Never   Smokeless tobacco: Never  Vaping Use   Vaping status: Never Used  Substance and Sexual Activity   Alcohol use: Yes    Comment: social   Drug use: No   Sexual activity: Yes    Birth control/protection: None  Other Topics Concern   Not on file  Social History Narrative   Not on file   Social Determinants of Health   Financial Resource Strain: Low Risk  (01/03/2023)   Overall Financial Resource Strain (CARDIA)    Difficulty of Paying Living Expenses: Not hard at all  Food Insecurity: No Food Insecurity (01/18/2023)   Hunger Vital Sign    Worried About Running Out of Food in the Last Year: Never true    Ran Out of Food in the Last Year: Never true  Transportation Needs: No Transportation Needs (01/18/2023)   PRAPARE - Administrator, Civil Service (Medical): No    Lack of Transportation (Non-Medical): No  Physical Activity: Sufficiently Active (01/03/2023)   Exercise Vital Sign    Days of Exercise per Week: 6 days    Minutes of Exercise per Session: 40 min  Stress: Stress Concern Present (01/03/2023)   [employer] of Occupational Health - Occupational Stress Questionnaire    Feeling of Stress : Rather much  Social Connections: Moderately Integrated (01/03/2023)   Social Connection and Isolation Panel [NHANES]    Frequency of Communication with Friends and Family: More than three times a week    Frequency of Social Gatherings with Friends and Family: More than three times a week    Attends Religious Services: 1 to 4 times per year    Active Member of [employer] or Organizations: No    Attends Engineer, structural: Not on file    Marital Status: Married  Catering manager  Violence: Not At Risk (01/18/2023)   Humiliation, Afraid, Rape, and Kick questionnaire    Fear of Current or Ex-Partner: No    Emotionally Abused: No    Physically Abused: No    Sexually Abused: No     Family History  Problem Relation Age of Onset   Diabetes Mellitus II Mother    Other Mother 38       Pituitary Mass-benign   Thyroid disease Sister        on Synthroid   Diabetes Mellitus II Sister    Diabetes Mellitus II Maternal Grandfather    Other Maternal Grandfather        Pituitary Mass-benign   Diabetes Mellitus II Paternal Grandmother    Deep vein thrombosis Paternal Grandmother        lifetime anticoagulation   Hypothyroidism Paternal Grandmother    Breast cancer Paternal Aunt        pat great aunt   Cancer Other 11       Breast     Current Outpatient Medications:    cetirizine (ZYRTEC)  10 MG tablet, Take by mouth., Disp: , Rfl:    apixaban (ELIQUIS) 5 MG TABS tablet, Take 1 tablet (5 mg total) by mouth 2 (two) times daily., Disp: 60 tablet, Rfl: 2   ESTARYLLA 0.25-35 MG-MCG tablet, SMARTSIG:1.0 Tablet(s) By Mouth Daily (Patient not taking: Reported on 01/18/2023), Disp: , Rfl:    Multiple Vitamins-Minerals (MULTIVITAMIN PO), Take by mouth., Disp: , Rfl:    omeprazole (PRILOSEC) 20 MG capsule, Take 1 capsule (20 mg total) by mouth daily., Disp: 90 capsule, Rfl: 3   Physical exam:  Vitals:   01/18/23 1335  BP: 125/66  Pulse: 74  Resp: 18  Temp: 97.7 F (36.5 C)  TempSrc: Tympanic  SpO2: 100%  Weight: 218 lb 12.8 oz (99.2 kg)  Height: 5\' 5"  (1.651 m)   Physical Exam Cardiovascular:     Rate and Rhythm: Normal rate and regular rhythm.     Heart sounds: Normal heart sounds.  Pulmonary:     Effort: Pulmonary effort is normal.     Breath sounds: Normal breath sounds.  Abdominal:     General: Bowel sounds are normal.     Palpations: Abdomen is soft.  Skin:    General: Skin is warm and dry.  Neurological:     Mental Status: She is alert and oriented to  person, place, and time.           Latest Ref Rng & Units 12/30/2022    4:28 AM  CMP  Glucose 70 - 99 mg/dL 161   BUN 6 - 20 mg/dL 16   Creatinine 0.96 - 1.00 mg/dL 0.45   Sodium 409 - 811 mmol/L 137   Potassium 3.5 - 5.1 mmol/L 3.9   Chloride 98 - 111 mmol/L 109   CO2 22 - 32 mmol/L 21   Calcium 8.9 - 10.3 mg/dL 8.3       Latest Ref Rng & Units 12/30/2022    4:28 AM  CBC  WBC 4.0 - 10.5 K/uL 7.9   Hemoglobin 12.0 - 15.0 g/dL 91.4   Hematocrit 78.2 - 46.0 % 30.6   Platelets 150 - 400 K/uL 200     No images are attached to the encounter.  ECHOCARDIOGRAM COMPLETE  Result Date: 12/30/2022    ECHOCARDIOGRAM REPORT   Patient Name:   ELVIE KARLSON Date of Exam: 12/30/2022 Medical Rec #:  [id number]               Height:       65.0 in Accession #:    [id number]              Weight:       200.0 lb Date of Birth:  09-04-1975              BSA:          1.978 m Patient Age:    46 years                BP:           117/74 mmHg Patient Gender: F                       HR:           68 bpm. Exam Location:  [facility] Procedure: 2D Echo, Cardiac Doppler and Color Doppler Indications:     NSTEMI  History:         Patient has no prior history of Echocardiogram examinations.  Acute MI. Pulmonary embolus.  Sonographer:     Mikki Harbor Referring Phys:  [id number] Marlow Baars DEW Diagnosing Phys: Julien Nordmann MD IMPRESSIONS  1. Left ventricular ejection fraction, by estimation, is 60 to 65%. The left ventricle has normal function. The left ventricle has no regional wall motion abnormalities. Left ventricular diastolic parameters were normal.  2. Right ventricular systolic function is low normal. The right ventricular size is moderately enlarged. There is normal pulmonary artery systolic pressure. The estimated right ventricular systolic pressure is 27.2 mmHg.  3. The mitral valve is normal in structure. Mild mitral valve regurgitation. No evidence of mitral stenosis.  4. Tricuspid  valve regurgitation is mild to moderate.  5. The aortic valve is tricuspid. Aortic valve regurgitation is not visualized. No aortic stenosis is present.  6. The inferior vena cava is normal in size with greater than 50% respiratory variability, suggesting right atrial pressure of 3 mmHg. FINDINGS  Left Ventricle: Left ventricular ejection fraction, by estimation, is 60 to 65%. The left ventricle has normal function. The left ventricle has no regional wall motion abnormalities. The left ventricular internal cavity size was normal in size. There is  no left ventricular hypertrophy. Left ventricular diastolic parameters were normal. Right Ventricle: The right ventricular size is moderately enlarged. No increase in right ventricular wall thickness. Right ventricular systolic function is low normal. There is normal pulmonary artery systolic pressure. The tricuspid regurgitant velocity  is 2.19 m/s, and with an assumed right atrial pressure of 8 mmHg, the estimated right ventricular systolic pressure is 27.2 mmHg. Left Atrium: Left atrial size was normal in size. Right Atrium: Right atrial size was normal in size. Pericardium: There is no evidence of pericardial effusion. Mitral Valve: The mitral valve is normal in structure. Mild mitral valve regurgitation. No evidence of mitral valve stenosis. MV peak gradient, 3.5 mmHg. The mean mitral valve gradient is 2.0 mmHg. Tricuspid Valve: The tricuspid valve is normal in structure. Tricuspid valve regurgitation is mild to moderate. No evidence of tricuspid stenosis. Aortic Valve: The aortic valve is tricuspid. Aortic valve regurgitation is not visualized. No aortic stenosis is present. Aortic valve mean gradient measures 4.0 mmHg. Aortic valve peak gradient measures 9.4 mmHg. Aortic valve area, by VTI measures 2.77 cm. Pulmonic Valve: The pulmonic valve was normal in structure. Pulmonic valve regurgitation is not visualized. No evidence of pulmonic stenosis. Aorta: The aortic  root is normal in size and structure. Venous: The inferior vena cava is normal in size with greater than 50% respiratory variability, suggesting right atrial pressure of 3 mmHg. IAS/Shunts: No atrial level shunt detected by color flow Doppler.  LEFT VENTRICLE PLAX 2D LVIDd:         4.70 cm   Diastology LVIDs:         2.40 cm   LV e' medial:    11.30 cm/s LV PW:         0.60 cm   LV E/e' medial:  7.9 LV IVS:        1.10 cm   LV e' lateral:   14.40 cm/s LVOT diam:     2.00 cm   LV E/e' lateral: 6.2 LV SV:         80 LV SV Index:   41 LVOT Area:     3.14 cm  RIGHT VENTRICLE RV Basal diam:  4.30 cm RV Mid diam:    3.20 cm RV S prime:     11.60 cm/s TAPSE (M-mode): 2.4 cm LEFT ATRIUM  Index        RIGHT ATRIUM           Index LA diam:        3.80 cm 1.92 cm/m   RA Area:     17.90 cm LA Vol (A2C):   59.1 ml 29.88 ml/m  RA Volume:   53.80 ml  27.20 ml/m LA Vol (A4C):   49.5 ml 25.03 ml/m LA Biplane Vol: 58.3 ml 29.47 ml/m  AORTIC VALVE                    PULMONIC VALVE AV Area (Vmax):    2.65 cm     PV Vmax:       0.94 m/s AV Area (Vmean):   2.48 cm     PV Peak grad:  3.5 mmHg AV Area (VTI):     2.77 cm AV Vmax:           153.00 cm/s AV Vmean:          88.700 cm/s AV VTI:            0.289 m AV Peak Grad:      9.4 mmHg AV Mean Grad:      4.0 mmHg LVOT Vmax:         129.00 cm/s LVOT Vmean:        70.000 cm/s LVOT VTI:          0.255 m LVOT/AV VTI ratio: 0.88  AORTA Ao Root diam: 3.20 cm MITRAL VALVE               TRICUSPID VALVE MV Area (PHT): 3.65 cm    TR Peak grad:   19.2 mmHg MV Area VTI:   3.14 cm    TR Vmax:        219.00 cm/s MV Peak grad:  3.5 mmHg MV Mean grad:  2.0 mmHg    SHUNTS MV Vmax:       0.94 m/s    Systemic VTI:  0.26 m MV Vmean:      64.0 cm/s   Systemic Diam: 2.00 cm MV Decel Time: 208 msec MV E velocity: 89.60 cm/s MV A velocity: 84.90 cm/s MV E/A ratio:  1.06 Julien Nordmann MD Electronically signed by Julien Nordmann MD Signature Date/Time: 12/30/2022/4:05:53 PM    Final     PERIPHERAL VASCULAR CATHETERIZATION  Result Date: 12/29/2022 See surgical note for result.  US Venous Img Lower Bilateral (DVT)  Result Date: 12/28/2022 CLINICAL DATA:  Pulmonary embolism EXAM: BILATERAL LOWER EXTREMITY VENOUS DOPPLER ULTRASOUND TECHNIQUE: Gray-scale sonography with graded compression, as well as color Doppler and duplex ultrasound were performed to evaluate the lower extremity deep venous systems from the level of the common femoral vein and including the common femoral, femoral, profunda femoral, popliteal and calf veins including the posterior tibial, peroneal and gastrocnemius veins when visible. The superficial great saphenous vein was also interrogated. Spectral Doppler was utilized to evaluate flow at rest and with distal augmentation maneuvers in the common femoral, femoral and popliteal veins. COMPARISON:  None available FINDINGS: RIGHT LOWER EXTREMITY Common Femoral Vein: No evidence of thrombus. Normal compressibility, respiratory phasicity and response to augmentation. Saphenofemoral Junction: No evidence of thrombus. Normal compressibility and flow on color Doppler imaging. Profunda Femoral Vein: No evidence of thrombus. Normal compressibility and flow on color Doppler imaging. Femoral Vein: No evidence of thrombus. Normal compressibility, respiratory phasicity and response to augmentation. Popliteal Vein: No evidence of thrombus. Normal compressibility, respiratory phasicity and  response to augmentation. Calf Veins: No evidence of thrombus. Normal compressibility and flow on color Doppler imaging. Superficial Great Saphenous Vein: No evidence of thrombus. Normal compressibility. Venous Reflux:  None. Other Findings:  None. LEFT LOWER EXTREMITY Common Femoral Vein: No evidence of thrombus. Normal compressibility, respiratory phasicity and response to augmentation. Saphenofemoral Junction: No evidence of thrombus. Normal compressibility and flow on color Doppler imaging.  Profunda Femoral Vein: No evidence of thrombus. Normal compressibility and flow on color Doppler imaging. Femoral Vein: Thrombosed. Popliteal Vein: Thrombosed. Calf Veins: Thrombosed. Superficial Great Saphenous Vein: No evidence of thrombus. Normal compressibility. Venous Reflux:  None. Other Findings:  None. IMPRESSION: Acute occlusive DVT of the left femoral, popliteal, and calf veins. Electronically Signed   By: Acquanetta Belling M.D.   On: 12/28/2022 18:18   DG Chest 2 View  Result Date: 12/28/2022 CLINICAL DATA:  Left-sided chest pain and shortness of breath. EXAM: CHEST - 2 VIEW COMPARISON:  None Available. FINDINGS: The heart size and mediastinal contours are within normal limits. Both lungs are clear. The visualized skeletal structures are unremarkable. IMPRESSION: No active cardiopulmonary disease. Electronically Signed   By: Danae Orleans M.D.   On: 12/28/2022 14:52   CT Angio Chest PE W and/or Wo Contrast  Result Date: 12/28/2022 CLINICAL DATA:  Shortness of breath, elevated heart rate EXAM: CT ANGIOGRAPHY CHEST WITH CONTRAST TECHNIQUE: Multidetector CT imaging of the chest was performed using the standard protocol during bolus administration of intravenous contrast. Multiplanar CT image reconstructions and MIPs were obtained to evaluate the vascular anatomy. RADIATION DOSE REDUCTION: This exam was performed according to the departmental dose-optimization program which includes automated exposure control, adjustment of the mA and/or kV according to patient size and/or use of iterative reconstruction technique. CONTRAST:  75mL OMNIPAQUE IOHEXOL 350 MG/ML SOLN COMPARISON:  None Available. FINDINGS: Cardiovascular: Acute pulmonary embolus seen in the bilateral distal right and left pulmonary arteries extending into the lobar arteries and bilateral segmental and subsegmental branches. Elevated RV to LV ratio of 1.9. Normal overall heart size. No pericardial effusion. No coronary artery calcifications.  Normal caliber thoracic aorta with no significant atherosclerotic disease. Mediastinum/Nodes: Esophagus and thyroid are unremarkable. No enlarged lymph nodes seen in the chest. Lungs/Pleura: central airways are patent. No consolidation, pleural effusion or pneumothorax. Upper Abdomen: No acute abnormality. Musculoskeletal: No chest wall abnormality. No acute or significant osseous findings. Review of the MIP images confirms the above findings. IMPRESSION: 1. Acute pulmonary embolus seen in the bilateral distal right and left pulmonary arteries extending into the lobar arteries and bilateral segmental and subsegmental branches. 2. Elevated RV to LV ratio of 1.9, consistent with right heart strain. Provider Dr.PATRICK ROBINSON, called PRA line to request STAT read and is aware of positive result. Electronically Signed   By: Allegra Lai M.D.   On: 12/28/2022 14:47    Assessment and plan- Patient is a 47 y.o. female referred for acute left lower extremity DVT and bilateral pulmonary embolism.  I have reviewed Doppler and CT scan results and discussed findings with the patient in detail which showed extensive left lower extremity DVT involving the femoral popliteal and calf veins as well as bilateral PE causing right heart strain.  She is s/p pulmonary thrombectomy.  She is presently on Eliquis.  It is hard to say that this was provoked due to birth control pills since she has been on it since 20 and never really had any prior history of embolism.  I will do a hypercoagulable workup today  including protein C, protein S, Antithrombin III, factor V Leiden and prothrombin gene mutation as well as testing for antiphospholipid antibody syndrome.  I will see her back in 2 to 3 weeks time to discuss the results of the blood work.  Patient will need to stay on full dose anticoagulation of Eliquis 5 mg twice daily for at least 1 year and following that we can calculate her HERDOO2 score to see if we can get her off  anticoagulation.  Thrombosis can at times be also secondary to malignancy but clinically there is no signs and symptoms of malignancy and I am holding off on getting any CT abdomen at this time.  After about 6 months of uninterrupted anticoagulation we can consider colonoscopy for her.  Will also repeat her echocardiogram 3 to 4 months down the line.   Thank you for this kind referral and the opportunity to participate in the care of this patient   Visit Diagnosis 1. Bilateral pulmonary embolism (HCC)   2. Acute deep vein thrombosis (DVT) of femoral vein of left lower extremity (HCC)     Dr. Owens Shark, MD, MPH [facility] [phone] 01/18/2023

## 2023-01-19 ENCOUNTER — Telehealth (INDEPENDENT_AMBULATORY_CARE_PROVIDER_SITE_OTHER): Payer: Self-pay

## 2023-01-19 LAB — HEX PHASE PHOSPHOLIPID REFLEX

## 2023-01-19 LAB — PROTEIN S PANEL
Protein S Activity: 106 % (ref 63–140)
Protein S Ag, Free: 129 % (ref 61–136)
Protein S Ag, Total: 93 % (ref 60–150)

## 2023-01-19 LAB — HEXAGONAL PHASE PHOSPHOLIPID: Hex Phosph Neut Test: 5 s (ref 0–11)

## 2023-01-19 LAB — LUPUS ANTICOAGULANT
DRVVT: 47.5 s — ABNORMAL HIGH (ref 0.0–47.0)
PTT Lupus Anticoagulant: 38.4 s (ref 0.0–43.5)
Thrombin Time: 19.5 s (ref 0.0–23.0)
dPT Confirm Ratio: 1.01 {ratio} (ref 0.00–1.34)
dPT: 40.3 s (ref 0.0–47.6)

## 2023-01-19 LAB — DRVVT MIX: dRVVT Mix: 45.2 s — ABNORMAL HIGH (ref 0.0–40.4)

## 2023-01-19 LAB — ANTITHROMBIN III: AntiThromb III Func: 103 % (ref 75–120)

## 2023-01-19 LAB — CARDIOLIPIN ANTIBODIES, IGG, IGM, IGA
Anticardiolipin IgA: <9 [APL'U]/mL (ref 0–11)
Anticardiolipin IgG: <9 [GPL'U]/mL (ref 0–14)
Anticardiolipin IgM: <9 [MPL'U]/mL (ref 0–12)

## 2023-01-19 LAB — BETA-2-GLYCOPROTEIN I ABS, IGG/M/A
Beta-2 Glyco I IgG: <9 GPI IgG units (ref 0–20)
Beta-2-Glycoprotein I IgA: <9 GPI IgA units (ref 0–25)
Beta-2-Glycoprotein I IgM: <9 GPI IgM units (ref 0–32)

## 2023-01-19 LAB — DRVVT CONFIRM: dRVVT Confirm: 0.8 {ratio} (ref 0.8–1.2)

## 2023-01-19 NOTE — Telephone Encounter (Signed)
Patient notified that her Travel insurance papers are ready. She will come get them in the morning.

## 2023-01-20 LAB — PROTEIN C, TOTAL: Protein C, Total: 100 % (ref 60–150)

## 2023-01-21 ENCOUNTER — Encounter: Payer: Self-pay | Admitting: Family Medicine

## 2023-01-21 LAB — LIPID PANEL
Chol/HDL Ratio: 4.6 {ratio} — ABNORMAL HIGH (ref 0.0–4.4)
Cholesterol, Total: 220 mg/dL — ABNORMAL HIGH (ref 100–199)
HDL: 48 mg/dL (ref >39)
LDL Chol Calc (NIH): 152 mg/dL — ABNORMAL HIGH (ref 0–99)
Triglycerides: 113 mg/dL (ref 0–149)
VLDL Cholesterol Cal: 20 mg/dL (ref 5–40)

## 2023-01-21 LAB — CBC
Hematocrit: 36 % (ref 34.0–46.6)
Hemoglobin: 11.1 g/dL (ref 11.1–15.9)
MCH: 27.2 pg (ref 26.6–33.0)
MCHC: 30.8 g/dL — ABNORMAL LOW (ref 31.5–35.7)
MCV: 88 fL (ref 79–97)
Platelets: 293 10*3/uL (ref 150–450)
RBC: 4.08 x10E6/uL (ref 3.77–5.28)
RDW: 12.5 % (ref 11.7–15.4)
WBC: 4.5 10*3/uL (ref 3.4–10.8)

## 2023-01-21 LAB — COMPREHENSIVE METABOLIC PANEL
ALT: 59 [IU]/L — ABNORMAL HIGH (ref 0–32)
AST: 48 [IU]/L — ABNORMAL HIGH (ref 0–40)
Albumin: 4.2 g/dL (ref 3.9–4.9)
Alkaline Phosphatase: 100 [IU]/L (ref 44–121)
BUN/Creatinine Ratio: 15 (ref 9–23)
BUN: 14 mg/dL (ref 6–24)
Bilirubin Total: 0.3 mg/dL (ref 0.0–1.2)
CO2: 23 mmol/L (ref 20–29)
Calcium: 9.2 mg/dL (ref 8.7–10.2)
Chloride: 105 mmol/L (ref 96–106)
Creatinine, Ser: 0.96 mg/dL (ref 0.57–1.00)
Globulin, Total: 2.6 g/dL (ref 1.5–4.5)
Glucose: 100 mg/dL — ABNORMAL HIGH (ref 70–99)
Potassium: 4.4 mmol/L (ref 3.5–5.2)
Sodium: 141 mmol/L (ref 134–144)
Total Protein: 6.8 g/dL (ref 6.0–8.5)
eGFR: 74 mL/min/{1.73_m2} (ref >59)

## 2023-01-21 LAB — TSH: TSH: 1.83 u[IU]/mL (ref 0.450–4.500)

## 2023-01-21 LAB — HEMOGLOBIN A1C
Est. average glucose Bld gHb Est-mCnc: 126 mg/dL
Hgb A1c MFr Bld: 6 % — ABNORMAL HIGH (ref 4.8–5.6)

## 2023-01-21 LAB — HEPATITIS C ANTIBODY: Hep C Virus Ab: NONREACTIVE

## 2023-01-21 NOTE — Telephone Encounter (Signed)
FYI  KP

## 2023-01-21 NOTE — Telephone Encounter (Signed)
Please review.  KP

## 2023-01-25 ENCOUNTER — Encounter: Payer: Self-pay | Admitting: Family Medicine

## 2023-01-25 LAB — PROTHROMBIN GENE MUTATION

## 2023-01-25 LAB — FACTOR 5 LEIDEN

## 2023-01-25 NOTE — Telephone Encounter (Signed)
Please review.  KP

## 2023-01-26 ENCOUNTER — Other Ambulatory Visit: Payer: Self-pay | Admitting: Family Medicine

## 2023-01-26 DIAGNOSIS — E7849 Other hyperlipidemia: Secondary | ICD-10-CM

## 2023-01-26 DIAGNOSIS — R7401 Elevation of levels of liver transaminase levels: Secondary | ICD-10-CM

## 2023-01-28 LAB — ANEMIA PROFILE A
Iron Saturation: 6 % — CL (ref 15–55)
Iron: 25 ug/dL — ABNORMAL LOW (ref 27–159)
Total Iron Binding Capacity: 417 ug/dL (ref 250–450)
UIBC: 392 ug/dL (ref 131–425)

## 2023-01-28 LAB — SPECIMEN STATUS REPORT

## 2023-02-02 ENCOUNTER — Encounter: Payer: Self-pay | Admitting: Family Medicine

## 2023-02-02 ENCOUNTER — Other Ambulatory Visit: Payer: Self-pay | Admitting: Family Medicine

## 2023-02-02 DIAGNOSIS — Z131 Encounter for screening for diabetes mellitus: Secondary | ICD-10-CM

## 2023-02-02 DIAGNOSIS — R7303 Prediabetes: Secondary | ICD-10-CM

## 2023-02-02 DIAGNOSIS — E785 Hyperlipidemia, unspecified: Secondary | ICD-10-CM

## 2023-02-02 DIAGNOSIS — D508 Other iron deficiency anemias: Secondary | ICD-10-CM

## 2023-02-02 DIAGNOSIS — R7401 Elevation of levels of liver transaminase levels: Secondary | ICD-10-CM

## 2023-02-08 ENCOUNTER — Encounter: Payer: Self-pay | Admitting: Oncology

## 2023-02-08 ENCOUNTER — Inpatient Hospital Stay (HOSPITAL_BASED_OUTPATIENT_CLINIC_OR_DEPARTMENT_OTHER): Payer: No Typology Code available for payment source | Admitting: Oncology

## 2023-02-08 DIAGNOSIS — I2699 Other pulmonary embolism without acute cor pulmonale: Secondary | ICD-10-CM

## 2023-02-08 NOTE — Progress Notes (Unsigned)
I connected with Jenna Mccarty on 02/08/23 at  3:00 PM EST by video enabled telemedicine visit and verified that I am speaking with the correct person using two identifiers.   I discussed the limitations, risks, security and privacy concerns of performing an evaluation and management service by telemedicine and the availability of in-person appointments. I also discussed with the patient that there may be a patient responsible charge related to this service. The patient expressed understanding and agreed to proceed.  Other persons participating in the visit and their role in the encounter:  none  Patient's location:  home Provider's location:  work  Stage manager Complaint: Discuss results of hypercoagulable workup  History of present illness: patient is a 47 year old premenopausal female who has been on birth control pills since she was 40.  She has experienced occasional cramping pain in her left leg since August 2024.  Over the last 2 weeks she started having more pain in her left leg as well as exertional shortness of breath and tachycardia and therefore went to the ER Bilateral lower extremity ultrasound showed acute occlusive DVT of the left femoral popliteal and calf veins.  CT angio chest showed bilateral distal right and left pulmonary artery embolism into the lobar arteries and segmental and subsegmental branches with right heart strain.  Echocardiogram showed right ventricular enlargement and mild to moderate tricuspid regurgitation.  She underwent thrombectomy of the pulmonary embolism by vascular surgery.  She has been on Eliquis since then.   No prior history of DVT or PE.  No significant family history of thrombosis.  No recent surgery or trauma.  Appetite and weight have remained stable.  She is up-to-date with her mammograms. Results of hypercoagulable workup from 01/18/2023 showed heterozygosity for factor V Leiden.  No evidence of prothrombin gene mutation.  Testing for antiphospholipid  antibody syndrome negative.  Protein C, protein S, Antithrombin III levels normal.  Interval history she is doing well on Eliquis and denies any complaints at this time   Review of Systems  Constitutional:  Negative for chills, fever, malaise/fatigue and weight loss.  HENT:  Negative for congestion, ear discharge and nosebleeds.   Eyes:  Negative for blurred vision.  Respiratory:  Negative for cough, hemoptysis, sputum production, shortness of breath and wheezing.   Cardiovascular:  Negative for chest pain, palpitations, orthopnea and claudication.  Gastrointestinal:  Negative for abdominal pain, blood in stool, constipation, diarrhea, heartburn, melena, nausea and vomiting.  Genitourinary:  Negative for dysuria, flank pain, frequency, hematuria and urgency.  Musculoskeletal:  Negative for back pain, joint pain and myalgias.  Skin:  Negative for rash.  Neurological:  Negative for dizziness, tingling, focal weakness, seizures, weakness and headaches.  Endo/Heme/Allergies:  Does not bruise/bleed easily.  Psychiatric/Behavioral:  Negative for depression and suicidal ideas. The patient does not have insomnia.     Allergies  Allergen Reactions   Penicillins Hives    Past Medical History:  Diagnosis Date   Cervical dysplasia    GERD (gastroesophageal reflux disease)    Hx of dysplastic nevus ~2018   L med ankle, txted by Dr. Cheree Ditto   Hx of dysplastic nevus ~2018   L vulva, txted by Dr. Cheree Ditto   Hx of dysplastic nevus    L upper abdomen, txed by Dr. Cheree Ditto    Past Surgical History:  Procedure Laterality Date   COLPOSCOPY  2002   KNEE SURGERY Left 1996   ACL reconstruction - patella tendon graft   PULMONARY THROMBECTOMY N/A 12/29/2022   Procedure:  PULMONARY THROMBECTOMY;  Surgeon: Annice Needy, MD;  Location: [facility] INVASIVE CV [facility];  Service: Cardiovascular;  Laterality: N/A;   TONSILLECTOMY     WISDOM TOOTH EXTRACTION      Social History   Socioeconomic History   Marital  status: Married    Spouse name: Not on file   Number of children: 0   Years of education: Not on file   Highest education level: Bachelor's degree (e.g., BA, AB, BS)  Occupational History   Not on file  Tobacco Use   Smoking status: Never   Smokeless tobacco: Never  Vaping Use   Vaping status: Never Used  Substance and Sexual Activity   Alcohol use: Yes    Comment: social   Drug use: No   Sexual activity: Yes    Birth control/protection: None  Other Topics Concern   Not on file  Social History Narrative   Not on file   Social Drivers of Health   Financial Resource Strain: Low Risk  (01/03/2023)   Overall Financial Resource Strain (CARDIA)    Difficulty of Paying Living Expenses: Not hard at all  Food Insecurity: No Food Insecurity (01/18/2023)   Hunger Vital Sign    Worried About Running Out of Food in the Last Year: Never true    Ran Out of Food in the Last Year: Never true  Transportation Needs: No Transportation Needs (01/18/2023)   PRAPARE - Administrator, Civil Service (Medical): No    Lack of Transportation (Non-Medical): No  Physical Activity: Sufficiently Active (01/03/2023)   Exercise Vital Sign    Days of Exercise per Week: 6 days    Minutes of Exercise per Session: 40 min  Stress: Stress Concern Present (01/03/2023)   [employer] of Occupational Health - Occupational Stress Questionnaire    Feeling of Stress : Rather much  Social Connections: Moderately Integrated (01/03/2023)   Social Connection and Isolation Panel [NHANES]    Frequency of Communication with Friends and Family: More than three times a week    Frequency of Social Gatherings with Friends and Family: More than three times a week    Attends Religious Services: 1 to 4 times per year    Active Member of [employer] or Organizations: No    Attends Engineer, structural: Not on file    Marital Status: Married  Catering manager Violence: Not At Risk (01/18/2023)   Humiliation,  Afraid, Rape, and Kick questionnaire    Fear of Current or Ex-Partner: No    Emotionally Abused: No    Physically Abused: No    Sexually Abused: No    Family History  Problem Relation Age of Onset   Diabetes Mellitus II Mother    Other Mother 67       Pituitary Mass-benign   Thyroid disease Sister        on Synthroid   Diabetes Mellitus II Sister    Diabetes Mellitus II Maternal Grandfather    Other Maternal Grandfather        Pituitary Mass-benign   Diabetes Mellitus II Paternal Grandmother    Deep vein thrombosis Paternal Grandmother        lifetime anticoagulation   Hypothyroidism Paternal Grandmother    Breast cancer Paternal Aunt        pat great aunt   Cancer Other 43       Breast     Current Outpatient Medications:    apixaban (ELIQUIS) 5 MG TABS tablet, Take 1 tablet (5  mg total) by mouth 2 (two) times daily., Disp: 60 tablet, Rfl: 2   cetirizine (ZYRTEC) 10 MG tablet, Take by mouth., Disp: , Rfl:    ESTARYLLA 0.25-35 MG-MCG tablet, SMARTSIG:1.0 Tablet(s) By Mouth Daily (Patient not taking: Reported on 01/18/2023), Disp: , Rfl:    Multiple Vitamins-Minerals (MULTIVITAMIN PO), Take by mouth., Disp: , Rfl:    omeprazole (PRILOSEC) 20 MG capsule, Take 1 capsule (20 mg total) by mouth daily., Disp: 90 capsule, Rfl: 3  No results found.  No images are attached to the encounter.      Latest Ref Rng & Units 01/20/2023    8:10 AM  CMP  Glucose 70 - 99 mg/dL 272   BUN 6 - 24 mg/dL 14   Creatinine 5.36 - 1.00 mg/dL 6.44   Sodium 034 - 742 mmol/L 141   Potassium 3.5 - 5.2 mmol/L 4.4   Chloride 96 - 106 mmol/L 105   CO2 20 - 29 mmol/L 23   Calcium 8.7 - 10.2 mg/dL 9.2   Total Protein 6.0 - 8.5 g/dL 6.8   Total Bilirubin 0.0 - 1.2 mg/dL 0.3   Alkaline Phos 44 - 121 IU/L 100   AST 0 - 40 IU/L 48   ALT 0 - 32 IU/L 59       Latest Ref Rng & Units 01/20/2023    8:10 AM  CBC  WBC 3.4 - 10.8 x10E3/uL x10E3/uL 4.5    CANCELED   Hemoglobin 11.1 - 15.9 g/dL g/dL 59.5     CANCELED   Hematocrit 34.0 - 46.6 % % 36.0    CANCELED   Platelets 150 - 450 x10E3/uL x10E3/uL 293    CANCELED      Observation/objective: Appears in no acute distress over video visit today.  Breathing is nonlabored  Assessment and plan:Patient is a 47 year old female referred for left lower extremity DVT and bilateral pulmonary embolism  Discussed the results of hypercoagulable workup with the patient which showed heterozygosity for factor V Leiden which is the most common genetic mutation that can be seen up to 2% of [place] population.  It increases the risk of thrombosis by 6-8 times as compared to general population but does not predict the risk of recurrent DVT necessarily.  As such heterozygosity for factor V Leiden by itself is not taking into consideration to decide the length of anticoagulation.  Given that patient had seemingly unprovoked left lower extremity DVT as well as massive pulmonary embolism she could potentially stay on Eliquis indefinitely versus we could consider stopping it after 1 year of anticoagulation.  Given that there was right ventricular dysfunction noted at the time of her pulmonary embolism diagnosis in November I will plan to get a repeat echocardiogram in 3 months time.  Patient was also noted to have mild anemia with an H&H of 10.2/30.6 in November 2024 and labs are indicated of iron deficiency given that her iron saturation was 6%.  Patient would like to continue to work with oral iron at this time.  I will repeat CBC ferritin and iron studies in 3 and 6 months and see her back in 6 months  Follow-up instructions: As above  I discussed the assessment and treatment plan with the patient. The patient was provided an opportunity to ask questions and all were answered. The patient agreed with the plan and demonstrated an understanding of the instructions.   The patient was advised to call back or seek an in-person evaluation if the symptoms worsen or if  the  condition fails to improve as anticipated.  I provided 12 minutes of face-to-face video visit time during this encounter, and > 50% was spent counseling as documented under my assessment & plan.  Visit Diagnosis: 1. Bilateral pulmonary embolism (HCC)     Dr. Owens Shark, MD, MPH [facility] Tel- [phone] 02/08/2023 4:23 PM

## 2023-03-01 ENCOUNTER — Ambulatory Visit: Payer: Self-pay | Admitting: Family Medicine

## 2023-04-18 ENCOUNTER — Encounter: Payer: Self-pay | Admitting: Oncology

## 2023-04-19 ENCOUNTER — Other Ambulatory Visit: Payer: Self-pay

## 2023-04-19 MED ORDER — APIXABAN 5 MG PO TABS: 5.0000 mg | ORAL_TABLET | Freq: Two times a day (BID) | ORAL | 2 refills | Status: DC

## 2023-05-12 ENCOUNTER — Ambulatory Visit
Admission: RE | Admit: 2023-05-12 | Discharge: 2023-05-12 | Disposition: A | Discharge status: Home / Self Care | Payer: No Typology Code available for payment source | Source: Ambulatory Visit | Attending: Oncology | Admitting: Oncology

## 2023-05-12 DIAGNOSIS — I2699 Other pulmonary embolism without acute cor pulmonale: Secondary | ICD-10-CM | POA: Diagnosis present

## 2023-05-12 DIAGNOSIS — I2609 Other pulmonary embolism with acute cor pulmonale: Secondary | ICD-10-CM | POA: Diagnosis not present

## 2023-05-12 LAB — ECHOCARDIOGRAM COMPLETE
AR max vel: 2.6 cm2
AV Area VTI: 2.83 cm2
AV Area mean vel: 2.51 cm2
AV Mean grad: 3 mmHg
AV Peak grad: 5.3 mmHg
Ao pk vel: 1.15 m/s
Area-P 1/2: 4.12 cm2
MV VTI: 2.41 cm2
S' Lateral: 2.4 cm

## 2023-05-12 NOTE — Progress Notes (Signed)
*  PRELIMINARY RESULTS* Echocardiogram 2D Echocardiogram has been performed.  Jenna Mccarty 05/12/2023, 9:41 AM

## 2023-05-24 NOTE — Progress Notes (Unsigned)
 PCP:  Jerrol Banana, MD   No chief complaint on file.    HPI:      [name] is a 48 y.o. G0P0000 who LMP was No LMP recorded., presents today for her annual examination.  Her menses are regular every 28-30 days, lasting 4 days on OCPs.  Dysmenorrhea none. She does not have intermenstrual bleeding.  Sex activity: single partner, contraception - OCP (estrogen/progesterone). No pain/bleeding. Last Pap: 05/07/22  Results were: ASCUS /pos HPV DNA; repeat pap due  Hx of STDs: HPV on pap  Mammogram: 06/29/22 Results no abnormalities, repeat in 12 months There is a FH of breast cancer in her pat grt aunt, genetic testing not indicated. There is no FH of ovarian cancer. The patient does do self-breast exams.  Tobacco use: The patient denies current or previous tobacco use. Alcohol use: social drinker No drug use.  Exercise: mod active  She does get adequate calcium and Vitamin D in her diet.  Borderline lipids and pre-DM, followed by PCP now.   Pt with worsening SUI. Sx with sneeze and cough, not jumping/exercise. Hasn't tried kegels/didn't do pelvic PT last yr. Drinks 1 caffeinated drink daily.  Past Medical History:  Diagnosis Date   Cervical dysplasia    GERD (gastroesophageal reflux disease)    Hx of dysplastic nevus ~2018   L med ankle, txted by Dr. Cheree Ditto   Hx of dysplastic nevus ~2018   L vulva, txted by Dr. Cheree Ditto   Hx of dysplastic nevus    L upper abdomen, txed by Dr. Cheree Ditto    Past Surgical History:  Procedure Laterality Date   COLPOSCOPY  2002   KNEE SURGERY Left 1996   ACL reconstruction - patella tendon graft   PULMONARY THROMBECTOMY N/A 12/29/2022   Procedure: PULMONARY THROMBECTOMY;  Surgeon: Annice Needy, MD;  Location: [facility] INVASIVE CV LAB;  Service: Cardiovascular;  Laterality: N/A;   TONSILLECTOMY     WISDOM TOOTH EXTRACTION      Family History  Problem Relation Age of Onset   Diabetes Mellitus II Mother    Other Mother 44        Pituitary Mass-benign   Thyroid disease Sister        on Synthroid   Diabetes Mellitus II Sister    Diabetes Mellitus II Maternal Grandfather    Other Maternal Grandfather        Pituitary Mass-benign   Diabetes Mellitus II Paternal Grandmother    Deep vein thrombosis Paternal Grandmother        lifetime anticoagulation   Hypothyroidism Paternal Grandmother    Breast cancer Paternal Aunt        pat great aunt   Cancer Other 48       Breast    Social History   Socioeconomic History   Marital status: Married    Spouse name: Not on file   Number of children: 0   Years of education: Not on file   Highest education level: Bachelor's degree (e.g., BA, AB, BS)  Occupational History   Not on file  Tobacco Use   Smoking status: Never   Smokeless tobacco: Never  Vaping Use   Vaping status: Never Used  Substance and Sexual Activity   Alcohol use: Yes    Comment: social   Drug use: No   Sexual activity: Yes    Birth control/protection: None  Other Topics Concern   Not on file  Social History Narrative   Not on file  Social Drivers of Corporate investment banker Strain: Low Risk  (01/03/2023)   Overall Financial Resource Strain (CARDIA)    Difficulty of Paying Living Expenses: Not hard at all  Food Insecurity: No Food Insecurity (01/18/2023)   Hunger Vital Sign    Worried About Running Out of Food in the Last Year: Never true    Ran Out of Food in the Last Year: Never true  Transportation Needs: No Transportation Needs (01/18/2023)   PRAPARE - Administrator, Civil Service (Medical): No    Lack of Transportation (Non-Medical): No  Physical Activity: Sufficiently Active (01/03/2023)   Exercise Vital Sign    Days of Exercise per Week: 6 days    Minutes of Exercise per Session: 40 min  Stress: Stress Concern Present (01/03/2023)   [employer] of Occupational Health - Occupational Stress Questionnaire    Feeling of Stress : Rather much  Social  Connections: Moderately Integrated (01/03/2023)   Social Connection and Isolation Panel [NHANES]    Frequency of Communication with Friends and Family: More than three times a week    Frequency of Social Gatherings with Friends and Family: More than three times a week    Attends Religious Services: 1 to 4 times per year    Active Member of [employer] or Organizations: No    Attends Engineer, structural: Not on file    Marital Status: Married  Catering manager Violence: Not At Risk (01/18/2023)   Humiliation, Afraid, Rape, and Kick questionnaire    Fear of Current or Ex-Partner: No    Emotionally Abused: No    Physically Abused: No    Sexually Abused: No    No outpatient medications have been marked as taking for the 05/25/23 encounter (Appointment) with Johnanthony Wilden, Helmut Muster B, PA-C.     ROS:  Review of Systems  Constitutional:  Negative for fatigue, fever and unexpected weight change.  Respiratory:  Negative for cough, shortness of breath and wheezing.   Cardiovascular:  Negative for chest pain, palpitations and leg swelling.  Gastrointestinal:  Negative for blood in stool, constipation, diarrhea, nausea and vomiting.  Endocrine: Negative for cold intolerance, heat intolerance and polyuria.  Genitourinary:  Negative for dyspareunia, dysuria, flank pain, frequency, genital sores, hematuria, menstrual problem, pelvic pain, urgency, vaginal bleeding, vaginal discharge and vaginal pain.  Musculoskeletal:  Negative for back pain, joint swelling and myalgias.  Skin:  Negative for rash.  Neurological:  Negative for dizziness, syncope, light-headedness, numbness and headaches.  Hematological:  Negative for adenopathy.  Psychiatric/Behavioral:  Negative for agitation, confusion, sleep disturbance and suicidal ideas. The patient is not nervous/anxious.      Objective: There were no vitals taken for this visit.   Physical Exam Constitutional:      Appearance: She is well-developed.   Genitourinary:     Vulva normal.     Right Labia: No rash, tenderness or lesions.    Left Labia: No tenderness, lesions or rash.    No vaginal discharge, erythema or tenderness.      Right Adnexa: not tender and no mass present.    Left Adnexa: not tender and no mass present.    No cervical motion tenderness, friability or polyp.     Uterus is not enlarged or tender.  Breasts:    Right: No mass, nipple discharge, skin change or tenderness.     Left: No mass, nipple discharge, skin change or tenderness.  Neck:     Thyroid: No thyromegaly.  Cardiovascular:  Rate and Rhythm: Normal rate and regular rhythm.     Heart sounds: Normal heart sounds. No murmur heard. Pulmonary:     Effort: Pulmonary effort is normal.     Breath sounds: Normal breath sounds.  Abdominal:     Palpations: Abdomen is soft.     Tenderness: There is no abdominal tenderness. There is no guarding or rebound.  Musculoskeletal:        General: Normal range of motion.     Cervical back: Normal range of motion.  Lymphadenopathy:     Cervical: No cervical adenopathy.  Neurological:     General: No focal deficit present.     Mental Status: She is alert and oriented to person, place, and time.     Cranial Nerves: No cranial nerve deficit.  Skin:    General: Skin is warm and dry.  Psychiatric:        Mood and Affect: Mood normal.        Behavior: Behavior normal.        Thought Content: Thought content normal.        Judgment: Judgment normal.  Vitals reviewed.    Assessment/Plan: Encounter for annual routine gynecological examination  Cervical cancer screening - Plan: Cytology - PAP  Screening for HPV (human papillomavirus) - Plan: Cytology - PAP  Encounter for surveillance of contraceptive pills - Plan: norgestimate-ethinyl estradiol (ESTARYLLA) 0.25-35 MG-MCG tablet; OCP RF  Encounter for screening mammogram for malignant neoplasm of breast - Plan: MM 3D SCREENING MAMMOGRAM BILATERAL BREAST; pt to  schedule mammo  Blood tests for routine general physical examination - Plan: Comprehensive metabolic panel, Lipid Panel With LDL/HDL Ratio, Hemoglobin Z3Y  Screening cholesterol level - Plan: Lipid Panel With LDL/HDL Ratio  Elevated lipids - Plan: Lipid Panel With LDL/HDL Ratio  Screening for diabetes mellitus - Plan: Hemoglobin A1c  BMI 36.0-36.9,adult - Plan: Comprehensive metabolic panel, Lipid Panel With LDL/HDL Ratio, Hemoglobin Q6V  SUI (stress urinary incontinence, female) - Plan: Ambulatory referral to Physical Therapy; start kegels; f/u prn.    No orders of the defined types were placed in this encounter.            GYN counsel adequate intake of calcium and vitamin D, diet and exercise     F/U  No follow-ups on file.  Haizel Gatchell B. Gurneet Matarese, PA-C 05/24/2023 2:09 PM

## 2023-05-25 ENCOUNTER — Other Ambulatory Visit (HOSPITAL_COMMUNITY)
Admission: RE | Admit: 2023-05-25 | Discharge: 2023-05-25 | Disposition: A | Discharge status: Home / Self Care | Source: Ambulatory Visit | Attending: Obstetrics and Gynecology | Admitting: Obstetrics and Gynecology

## 2023-05-25 ENCOUNTER — Ambulatory Visit (INDEPENDENT_AMBULATORY_CARE_PROVIDER_SITE_OTHER): Payer: No Typology Code available for payment source | Admitting: Obstetrics and Gynecology

## 2023-05-25 ENCOUNTER — Encounter: Payer: Self-pay | Admitting: Obstetrics and Gynecology

## 2023-05-25 VITALS — BP 106/69 | HR 67 | Ht 65.0 in | Wt 225.0 lb

## 2023-05-25 DIAGNOSIS — Z1231 Encounter for screening mammogram for malignant neoplasm of breast: Secondary | ICD-10-CM

## 2023-05-25 DIAGNOSIS — N393 Stress incontinence (female) (male): Secondary | ICD-10-CM

## 2023-05-25 DIAGNOSIS — Z124 Encounter for screening for malignant neoplasm of cervix: Secondary | ICD-10-CM

## 2023-05-25 DIAGNOSIS — R87610 Atypical squamous cells of undetermined significance on cytologic smear of cervix (ASC-US): Secondary | ICD-10-CM

## 2023-05-25 DIAGNOSIS — Z1151 Encounter for screening for human papillomavirus (HPV): Secondary | ICD-10-CM | POA: Diagnosis present

## 2023-05-25 DIAGNOSIS — Z01419 Encounter for gynecological examination (general) (routine) without abnormal findings: Secondary | ICD-10-CM | POA: Diagnosis not present

## 2023-05-25 DIAGNOSIS — R87810 Cervical high risk human papillomavirus (HPV) DNA test positive: Secondary | ICD-10-CM | POA: Insufficient documentation

## 2023-05-25 DIAGNOSIS — Z3041 Encounter for surveillance of contraceptive pills: Secondary | ICD-10-CM

## 2023-05-25 MED ORDER — NORETHINDRONE 0.35 MG PO TABS: 1.0000 | ORAL_TABLET | Freq: Every day | ORAL | 3 refills | Status: AC

## 2023-05-25 NOTE — Patient Instructions (Signed)
 I value your feedback and you entrusting [place] with your care. If you get a [facility] patient survey, I would appreciate you taking the time to let us know about your experience today. Thank you!  [facility] ([place])--[phone]

## 2023-05-27 ENCOUNTER — Encounter: Payer: Self-pay | Admitting: Obstetrics and Gynecology

## 2023-05-27 LAB — CYTOLOGY - PAP
Comment: NEGATIVE
Diagnosis: NEGATIVE
High risk HPV: NEGATIVE

## 2023-07-06 ENCOUNTER — Encounter (INDEPENDENT_AMBULATORY_CARE_PROVIDER_SITE_OTHER): Payer: Self-pay

## 2023-07-18 ENCOUNTER — Encounter: Payer: Self-pay | Admitting: Oncology

## 2023-07-18 ENCOUNTER — Other Ambulatory Visit: Payer: Self-pay | Admitting: Oncology

## 2023-07-19 ENCOUNTER — Other Ambulatory Visit: Payer: Self-pay

## 2023-07-19 ENCOUNTER — Encounter: Payer: Self-pay | Admitting: Dermatology

## 2023-07-19 ENCOUNTER — Ambulatory Visit: Payer: 59 | Admitting: Dermatology

## 2023-07-19 DIAGNOSIS — Z86018 Personal history of other benign neoplasm: Secondary | ICD-10-CM

## 2023-07-19 DIAGNOSIS — Z1283 Encounter for screening for malignant neoplasm of skin: Secondary | ICD-10-CM | POA: Diagnosis not present

## 2023-07-19 DIAGNOSIS — L578 Other skin changes due to chronic exposure to nonionizing radiation: Secondary | ICD-10-CM

## 2023-07-19 DIAGNOSIS — D2261 Melanocytic nevi of right upper limb, including shoulder: Secondary | ICD-10-CM

## 2023-07-19 DIAGNOSIS — L821 Other seborrheic keratosis: Secondary | ICD-10-CM

## 2023-07-19 DIAGNOSIS — W908XXA Exposure to other nonionizing radiation, initial encounter: Secondary | ICD-10-CM | POA: Diagnosis not present

## 2023-07-19 DIAGNOSIS — L814 Other melanin hyperpigmentation: Secondary | ICD-10-CM | POA: Diagnosis not present

## 2023-07-19 DIAGNOSIS — D229 Melanocytic nevi, unspecified: Secondary | ICD-10-CM

## 2023-07-19 DIAGNOSIS — D1801 Hemangioma of skin and subcutaneous tissue: Secondary | ICD-10-CM

## 2023-07-19 DIAGNOSIS — D225 Melanocytic nevi of trunk: Secondary | ICD-10-CM

## 2023-07-19 MED ORDER — APIXABAN 5 MG PO TABS: 5.0000 mg | ORAL_TABLET | Freq: Two times a day (BID) | ORAL | 2 refills | Status: DC

## 2023-07-19 NOTE — Patient Instructions (Addendum)

## 2023-07-19 NOTE — Progress Notes (Signed)
   Follow-Up Visit   Subjective  Jenna Mccarty is a 48 y.o. female who presents for the following: Skin Cancer Screening and Full Body Skin Exam, hx of Dysplastic Nevi  The patient presents for Total-Body Skin Exam (TBSE) for skin cancer screening and mole check. The patient has spots, moles and lesions to be evaluated, some may be new or changing and the patient may have concern these could be cancer.    The following portions of the chart were reviewed this encounter and updated as appropriate: medications, allergies, medical history  Review of Systems:  No other skin or systemic complaints except as noted in HPI or Assessment and Plan.  Objective  Well appearing patient in no apparent distress; mood and affect are within normal limits.  A full examination was performed including scalp, head, eyes, ears, nose, lips, neck, chest, axillae, abdomen, back, buttocks, bilateral upper extremities, bilateral lower extremities, hands, feet, fingers, toes, fingernails, and toenails. All findings within normal limits unless otherwise noted below.   Relevant physical exam findings are noted in the Assessment and Plan.    Assessment & Plan   SKIN CANCER SCREENING PERFORMED TODAY.  ACTINIC DAMAGE chest - Chronic condition, secondary to cumulative UV/sun exposure - diffuse scaly erythematous macules with underlying dyspigmentation - Recommend daily broad spectrum sunscreen SPF 30+ to sun-exposed areas, reapply every 2 hours as needed.  - Staying in the shade or wearing long sleeves, sun glasses (UVA+UVB protection) and wide brim hats (4-inch brim around the entire circumference of the hat) are also recommended for sun protection.  - Call for new or changing lesions.  LENTIGINES, SEBORRHEIC KERATOSES, HEMANGIOMAS - Benign normal skin lesions Back, legs, abdomen - Benign-appearing - Call for any changes  MELANOCYTIC NEVI - Tan-brown and/or pink-flesh-colored symmetric macules and  papules - R chest - 4.54mm brown flat pap, darker at inf edge - R elbow - 4.28mm brown pap - L upper abdomen - 4.0 x 3.30mm med dark brown macule - L spinal mid back - 5.4mm light brown pap - R lower sternum 3.22mm med brown macule darker center - Benign appearing on exam today - Observation - Call clinic for new or changing moles    HISTORY OF DYSPLASTIC NEVUS No evidence of recurrence today- L med ankle, L vulva, L upper abdomen Recommend regular full body skin exams Recommend daily broad spectrum sunscreen SPF 30+ to sun-exposed areas, reapply every 2 hours as needed.  Call if any new or changing lesions are noted between office visits       Return in about 1 year (around 07/18/2024) for TBSE, Hx of Dysplastic nevi.  I, Rollie Clipper, RMA, am acting as scribe for Artemio Larry, MD .   Documentation: I have reviewed the above documentation for accuracy and completeness, and I agree with the above.  Artemio Larry, MD

## 2023-08-09 ENCOUNTER — Inpatient Hospital Stay: Payer: No Typology Code available for payment source | Attending: Oncology

## 2023-08-09 ENCOUNTER — Encounter: Payer: Self-pay | Admitting: Oncology

## 2023-08-09 ENCOUNTER — Ambulatory Visit: Payer: Self-pay | Admitting: Oncology

## 2023-08-09 ENCOUNTER — Inpatient Hospital Stay (HOSPITAL_BASED_OUTPATIENT_CLINIC_OR_DEPARTMENT_OTHER): Payer: No Typology Code available for payment source | Admitting: Oncology

## 2023-08-09 VITALS — BP 116/77 | HR 69 | Temp 98.9°F | Resp 18 | Ht 65.0 in | Wt 228.0 lb

## 2023-08-09 DIAGNOSIS — D6851 Activated protein C resistance: Secondary | ICD-10-CM | POA: Diagnosis present

## 2023-08-09 DIAGNOSIS — I2699 Other pulmonary embolism without acute cor pulmonale: Secondary | ICD-10-CM | POA: Diagnosis not present

## 2023-08-09 DIAGNOSIS — Z7901 Long term (current) use of anticoagulants: Secondary | ICD-10-CM

## 2023-08-09 DIAGNOSIS — Z86711 Personal history of pulmonary embolism: Secondary | ICD-10-CM | POA: Diagnosis not present

## 2023-08-09 DIAGNOSIS — I82412 Acute embolism and thrombosis of left femoral vein: Secondary | ICD-10-CM

## 2023-08-09 DIAGNOSIS — Z86718 Personal history of other venous thrombosis and embolism: Secondary | ICD-10-CM | POA: Insufficient documentation

## 2023-08-09 LAB — CBC WITH DIFFERENTIAL (CANCER CENTER ONLY)
Abs Immature Granulocytes: 0.02 10*3/uL (ref 0.00–0.07)
Basophils Absolute: 0 10*3/uL (ref 0.0–0.1)
Basophils Relative: 1 %
Eosinophils Absolute: 0.1 10*3/uL (ref 0.0–0.5)
Eosinophils Relative: 2 %
HCT: 38.7 % (ref 36.0–46.0)
Hemoglobin: 12.9 g/dL (ref 12.0–15.0)
Immature Granulocytes: 0 %
Lymphocytes Relative: 30 %
Lymphs Abs: 2.1 10*3/uL (ref 0.7–4.0)
MCH: 31.4 pg (ref 26.0–34.0)
MCHC: 33.3 g/dL (ref 30.0–36.0)
MCV: 94.2 fL (ref 80.0–100.0)
Monocytes Absolute: 0.6 10*3/uL (ref 0.1–1.0)
Monocytes Relative: 8 %
Neutro Abs: 4.3 10*3/uL (ref 1.7–7.7)
Neutrophils Relative %: 59 %
Platelet Count: 265 10*3/uL (ref 150–400)
RBC: 4.11 MIL/uL (ref 3.87–5.11)
RDW: 13.2 % (ref 11.5–15.5)
WBC Count: 7.1 10*3/uL (ref 4.0–10.5)
nRBC: 0 % (ref 0.0–0.2)

## 2023-08-09 LAB — CMP (CANCER CENTER ONLY)
ALT: 19 U/L (ref 0–44)
AST: 21 U/L (ref 15–41)
Albumin: 4.3 g/dL (ref 3.5–5.0)
Alkaline Phosphatase: 82 U/L (ref 38–126)
Anion gap: 8 (ref 5–15)
BUN: 20 mg/dL (ref 6–20)
CO2: 23 mmol/L (ref 22–32)
Calcium: 8.8 mg/dL — ABNORMAL LOW (ref 8.9–10.3)
Chloride: 104 mmol/L (ref 98–111)
Creatinine: 0.8 mg/dL (ref 0.44–1.00)
GFR, Estimated: >60 mL/min (ref >60)
Glucose, Bld: 97 mg/dL (ref 70–99)
Potassium: 4 mmol/L (ref 3.5–5.1)
Sodium: 135 mmol/L (ref 135–145)
Total Bilirubin: 0.5 mg/dL (ref 0.0–1.2)
Total Protein: 7.3 g/dL (ref 6.5–8.1)

## 2023-08-09 LAB — FERRITIN: Ferritin: 28 ng/mL (ref 11–307)

## 2023-08-09 LAB — IRON AND TIBC
Iron: 46 ug/dL (ref 28–170)
Saturation Ratios: 13 % (ref 10.4–31.8)
TIBC: 356 ug/dL (ref 250–450)
UIBC: 310 ug/dL

## 2023-08-09 NOTE — Progress Notes (Signed)
 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  She has experienced occasional cramping pain in her left leg since August 2024.  Over the last 2 weeks she started having more pain in her left leg as well as exertional shortness of breath and tachycardia and therefore went to the ER Bilateral lower extremity ultrasound showed acute occlusive DVT of the left femoral popliteal and calf veins.  CT angio chest showed bilateral distal right and left pulmonary artery embolism into the lobar arteries and segmental and subsegmental branches with right heart strain.  Echocardiogram showed right ventricular enlargement and mild to moderate tricuspid regurgitation.  She underwent thrombectomy of the pulmonary embolism by vascular surgery.  She has been on Eliquis  since then.   No prior history of DVT or PE.  No significant family history of thrombosis.  No recent surgery or trauma.  Appetite and weight have remained stable.  She is up-to-date with her mammograms. Results of hypercoagulable workup from 01/18/2023 showed heterozygosity for factor V Leiden.  No evidence of prothrombin gene mutation.  Testing for antiphospholipid antibody syndrome negative.  Protein C, protein S, Antithrombin III  levels normal.  Interval history-patient has been having some symptoms of menorrhagia which have been bothering her and she was placed on progestin only  pill about 2 months ago.  Patient does not like the idea of getting an IUD.  She is on Eliquis  and has not missed any doses.  Denies any bleeding issues on Eliquis .  She has undergone left knee surgery as well as sprained her left ankle in the past and has been experiencing occasional left ankle swelling  ECOG PS- 1 Pain scale- 0   Review of systems- Review of Systems  Constitutional:  Negative for chills, fever, malaise/fatigue and weight loss.  HENT:  Negative for congestion, ear discharge and nosebleeds.   Eyes:  Negative for blurred vision.  Respiratory:  Negative for cough, hemoptysis, sputum production, shortness of breath and wheezing.   Cardiovascular:  Negative for chest pain, palpitations, orthopnea and claudication.  Gastrointestinal:  Negative for abdominal pain, blood in stool, constipation, diarrhea, heartburn, melena, nausea and vomiting.  Genitourinary:  Negative for dysuria, flank pain, frequency, hematuria and urgency.  Musculoskeletal:  Negative for back pain, joint pain and myalgias.  Skin:  Negative for rash.  Neurological:  Negative for dizziness, tingling, focal weakness, seizures, weakness and headaches.  Endo/Heme/Allergies:  Does not bruise/bleed easily.  Psychiatric/Behavioral:  Negative for depression and suicidal ideas. The patient does not have insomnia.       Allergies  Allergen Reactions   Penicillins Hives     Past Medical History:  Diagnosis Date   Cervical dysplasia    DVT (deep venous thrombosis) (HCC)    Factor V Leiden (HCC)    GERD (gastroesophageal reflux disease)    Hx of dysplastic nevus ~2018   L med ankle, txted by Dr. Arlyss   Hx of dysplastic nevus ~2018   L vulva,  txted by Dr. Arlyss   Hx of dysplastic nevus    L upper abdomen, txed by Dr. Arlyss   Pulmonary embolism [facility]) 12/2022     Past Surgical History:  Procedure Laterality Date   COLPOSCOPY  2002   KNEE SURGERY Left 1996   ACL reconstruction - patella tendon graft    PULMONARY THROMBECTOMY N/A 12/29/2022   Procedure: PULMONARY THROMBECTOMY;  Surgeon: Marea Selinda RAMAN, MD;  Location: [facility] INVASIVE CV LAB;  Service: Cardiovascular;  Laterality: N/A;   TONSILLECTOMY     WISDOM TOOTH EXTRACTION      Social History   Socioeconomic History   Marital status: Married    Spouse name: Not on file   Number of children: 0   Years of education: Not on file   Highest education level: Bachelor's degree (e.g., BA, AB, BS)  Occupational History   Not on file  Tobacco Use   Smoking status: Never   Smokeless tobacco: Never  Vaping Use   Vaping status: Never Used  Substance and Sexual Activity   Alcohol use: Yes    Comment: social   Drug use: No   Sexual activity: Yes    Birth control/protection: None  Other Topics Concern   Not on file  Social History Narrative   Not on file   Social Drivers of Health   Financial Resource Strain: Low Risk  (01/03/2023)   Overall Financial Resource Strain (CARDIA)    Difficulty of Paying Living Expenses: Not hard at all  Food Insecurity: No Food Insecurity (01/18/2023)   Hunger Vital Sign    Worried About Running Out of Food in the Last Year: Never true    Ran Out of Food in the Last Year: Never true  Transportation Needs: No Transportation Needs (01/18/2023)   PRAPARE - Administrator, Civil Service (Medical): No    Lack of Transportation (Non-Medical): No  Physical Activity: Sufficiently Active (01/03/2023)   Exercise Vital Sign    Days of Exercise per Week: 6 days    Minutes of Exercise per Session: 40 min  Stress: Stress Concern Present (01/03/2023)   [employer] of Occupational Health - Occupational Stress Questionnaire    Feeling of Stress : Rather much  Social Connections: Moderately Integrated (01/03/2023)   Social Connection and Isolation Panel    Frequency of Communication with Friends and Family: More than three times a week    Frequency of Social Gatherings with Friends and Family: More  than three times a week    Attends Religious Services: 1 to 4 times per year    Active Member of [employer] or Organizations: No    Attends Banker Meetings: Not on file    Marital Status: Married  Catering manager Violence: Not At Risk (01/18/2023)   Humiliation, Afraid, Rape, and Kick questionnaire    Fear of Current or Ex-Partner: No    Emotionally Abused: No    Physically Abused: No    Sexually Abused: No    Family History  Problem Relation Age of Onset   Diabetes Mellitus II Mother    Other Mother 51       Pituitary Mass-benign   Thyroid disease Sister        on Synthroid   Diabetes Mellitus II Sister    Diabetes Mellitus II Maternal Grandfather    Other Maternal Grandfather        Pituitary Mass-benign   Diabetes Mellitus II Paternal Grandmother    Deep vein thrombosis Paternal  Grandmother        lifetime anticoagulation   Hypothyroidism Paternal Grandmother    Breast cancer Paternal Aunt        pat great aunt   Cancer Other 35       Breast     Current Outpatient Medications:    apixaban  (ELIQUIS ) 5 MG TABS tablet, Take 1 tablet (5 mg total) by mouth 2 (two) times daily., Disp: 60 tablet, Rfl: 2   ferrous sulfate 325 (65 FE) MG EC tablet, Take 325 mg by mouth 3 (three) times daily with meals., Disp: , Rfl:    Multiple Vitamins-Minerals (MULTIVITAMIN PO), Take by mouth., Disp: , Rfl:    norethindrone  (MICRONOR ) 0.35 MG tablet, Take 1 tablet (0.35 mg total) by mouth daily., Disp: 84 tablet, Rfl: 3   omeprazole  (PRILOSEC) 20 MG capsule, Take 1 capsule (20 mg total) by mouth daily., Disp: 90 capsule, Rfl: 3   cetirizine (ZYRTEC) 10 MG tablet, Take by mouth. (Patient not taking: Reported on 08/09/2023), Disp: , Rfl:   Physical exam:  Vitals:   08/09/23 1449  BP: 116/77  Pulse: 69  Resp: 18  Temp: 98.9 F (37.2 C)  TempSrc: Tympanic  SpO2: 99%  Weight: 228 lb (103.4 kg)  Height: 5' 5 (1.651 m)   Physical Exam  Cardiovascular:     Rate and Rhythm:  Normal rate and regular rhythm.     Heart sounds: Normal heart sounds.  Pulmonary:     Effort: Pulmonary effort is normal.     Breath sounds: Normal breath sounds.  Abdominal:     General: Bowel sounds are normal.     Palpations: Abdomen is soft.   Musculoskeletal:     Comments: Trace left ankle edema.  Bilateral lower extremities otherwise appear similar in size   Skin:    General: Skin is warm and dry.   Neurological:     Mental Status: She is alert and oriented to person, place, and time.      I have personally reviewed labs listed below:    Latest Ref Rng & Units 08/09/2023    2:34 PM  CMP  Glucose 70 - 99 mg/dL 97   BUN 6 - 20 mg/dL 20   Creatinine 9.55 - 1.00 mg/dL 9.19   Sodium 864 - 854 mmol/L 135   Potassium 3.5 - 5.1 mmol/L 4.0   Chloride 98 - 111 mmol/L 104   CO2 22 - 32 mmol/L 23   Calcium 8.9 - 10.3 mg/dL 8.8   Total Protein 6.5 - 8.1 g/dL 7.3   Total Bilirubin 0.0 - 1.2 mg/dL 0.5   Alkaline Phos 38 - 126 U/L 82   AST 15 - 41 U/L 21   ALT 0 - 44 U/L 19       Latest Ref Rng & Units 08/09/2023    2:34 PM  CBC  WBC 4.0 - 10.5 K/uL 7.1   Hemoglobin 12.0 - 15.0 g/dL 87.0   Hematocrit 63.9 - 46.0 % 38.7   Platelets 150 - 400 K/uL 265      Assessment and plan- Patient is a 48 y.o. female with history of left lower extremity DVT and bilateral PE unprovoked currently on Eliquis  with history of heterozygosity for factor V Leiden mutation here for routine follow-up  In the presence of heterozygosity for factor V Leiden and unprovoked PE I would like the patient to remain on Eliquis  lifelong.  Especially patient has started taking progestin only pill in the last 2 months  and it would be even more important for her to stay on anticoagulation while she is on birth control.  Progestin only pill carries a lower risk of DVT PE as compared to combined estrogen progesterone containing pills.  Patient has trace left ankle edema on today's exam but overall her bilateral  lower extremities appear similar in size.  If there is any worsening pain in her left lower extremity or progressive swelling she will reach out to me and I will arrange for ultrasound Doppler at that time.  Also discussed the results of echocardiogram from March 2025 which was normal in terms of both right and left heart function.  Hemoglobin is normal and iron studies are pending.  I will see her back in 1 year with labs   Visit Diagnosis 1. Current use of long term anticoagulation   2. Bilateral pulmonary embolism (HCC)      Dr. Annah Skene, MD, MPH [facility] [phone] 08/09/2023 3:30 PM

## 2023-08-09 NOTE — Progress Notes (Signed)
 Patient is doing well, no new questions or concerns for the doctor today.

## 2023-08-10 ENCOUNTER — Ambulatory Visit
Admission: RE | Admit: 2023-08-10 | Discharge: 2023-08-10 | Disposition: A | Discharge status: Home / Self Care | Source: Ambulatory Visit | Attending: Obstetrics and Gynecology | Admitting: Obstetrics and Gynecology

## 2023-08-10 DIAGNOSIS — Z1231 Encounter for screening mammogram for malignant neoplasm of breast: Secondary | ICD-10-CM | POA: Diagnosis present

## 2023-08-10 NOTE — Telephone Encounter (Signed)
 Patient responded back to my chart message.  No further follow up needed at this time. Orders are in.

## 2023-08-10 NOTE — Telephone Encounter (Signed)
-----   Message from Annah JAYSON Skene sent at 08/09/2023  3:57 PM EDT ----- Jereld -she is iron deficient but she is not anemic.  Since she is overall asymptomatic and using oral iron every other day I would be okay with her just doing that at this time.  We can repeat CBC  ferritin and iron studies in 6 months time.  Please let her know Thanks, Annah ----- Message ----- From: Rebecka, Lab In [place] Sent: 08/09/2023   2:43 PM EDT To: Annah JAYSON Skene, MD

## 2023-08-10 NOTE — Telephone Encounter (Signed)
 Per Dr. Melanee Bence -she is iron deficient but she is not anemic.  Since she is overall asymptomatic and using oral iron every other day I would be okay with her just doing that at this time.  We can repeat CBC ferritin and iron studies in 6 months time.  Please let her know. Outbound call to patient;

## 2023-08-12 ENCOUNTER — Ambulatory Visit: Payer: Self-pay | Admitting: Obstetrics and Gynecology

## 2023-10-17 ENCOUNTER — Other Ambulatory Visit: Payer: Self-pay | Admitting: Oncology

## 2024-01-04 ENCOUNTER — Ambulatory Visit (INDEPENDENT_AMBULATORY_CARE_PROVIDER_SITE_OTHER): Payer: No Typology Code available for payment source | Admitting: Vascular Surgery

## 2024-01-04 ENCOUNTER — Encounter (INDEPENDENT_AMBULATORY_CARE_PROVIDER_SITE_OTHER): Payer: Self-pay | Admitting: Vascular Surgery

## 2024-01-04 VITALS — BP 126/86 | HR 74 | Resp 16 | Ht 65.0 in | Wt 229.6 lb

## 2024-01-04 DIAGNOSIS — I2609 Other pulmonary embolism with acute cor pulmonale: Secondary | ICD-10-CM | POA: Diagnosis not present

## 2024-01-04 DIAGNOSIS — I82409 Acute embolism and thrombosis of unspecified deep veins of unspecified lower extremity: Secondary | ICD-10-CM | POA: Insufficient documentation

## 2024-01-04 DIAGNOSIS — Z86718 Personal history of other venous thrombosis and embolism: Secondary | ICD-10-CM

## 2024-01-04 DIAGNOSIS — I82412 Acute embolism and thrombosis of left femoral vein: Secondary | ICD-10-CM | POA: Diagnosis not present

## 2024-01-04 NOTE — Progress Notes (Signed)
 MRN : [id number]  Jenna Mccarty is a 48 y.o. (1975-10-02) female who presents with chief complaint of  Chief Complaint  Patient presents with   Follow-up    1 year fu  .  History of Present Illness: Patient returns today in follow up of her DVT and PE.  She had pulmonary thrombectomy done about a year ago and is doing great.  She has really had no other problems or issues since that time.  She is tolerated full dose anticoagulation without issue.  She denies any bleeding problems and says she bleeds no more readily than she did before she was on the medicine.  No chest pain or shortness of breath.  No significant leg swelling or pain.  Her hematologist also recommended she stay on at least some degree of Eliquis  due to now being on hormone replacement therapy  Current Outpatient Medications  Medication Sig Dispense Refill   ELIQUIS  5 MG TABS tablet TAKE 1 TABLET(5 MG) BY MOUTH TWICE DAILY 60 tablet 2   Multiple Vitamins-Minerals (MULTIVITAMIN PO) Take by mouth.     norethindrone  (MICRONOR ) 0.35 MG tablet Take 1 tablet (0.35 mg total) by mouth daily. 84 tablet 3   omeprazole  (PRILOSEC) 20 MG capsule Take 1 capsule (20 mg total) by mouth daily. 90 capsule 3   cetirizine (ZYRTEC) 10 MG tablet Take by mouth. (Patient not taking: Reported on 08/09/2023)     ferrous sulfate 325 (65 FE) MG EC tablet Take 325 mg by mouth 3 (three) times daily with meals.     No current facility-administered medications for this visit.    Past Medical History:  Diagnosis Date   Cervical dysplasia    DVT (deep venous thrombosis) (HCC)    Factor V Leiden    GERD (gastroesophageal reflux disease)    Hx of dysplastic nevus ~2018   L med ankle, txted by Dr. Arlyss   Hx of dysplastic nevus ~2018   L vulva, txted by Dr. Arlyss   Hx of dysplastic nevus    L upper abdomen, txed by Dr. Arlyss   Pulmonary embolism [facility]) 12/2022    Past Surgical History:  Procedure Laterality Date   COLPOSCOPY  2002    KNEE SURGERY Left 1996   ACL reconstruction - patella tendon graft   PULMONARY THROMBECTOMY N/A 12/29/2022   Procedure: PULMONARY THROMBECTOMY;  Surgeon: Marea Selinda RAMAN, MD;  Location: [facility] INVASIVE CV LAB;  Service: Cardiovascular;  Laterality: N/A;   TONSILLECTOMY     WISDOM TOOTH EXTRACTION       Social History   Tobacco Use   Smoking status: Never   Smokeless tobacco: Never  Vaping Use   Vaping status: Never Used  Substance Use Topics   Alcohol use: Yes    Comment: social   Drug use: No       Family History  Problem Relation Age of Onset   Diabetes Mellitus II Mother    Other Mother 69       Pituitary Mass-benign   Thyroid disease Sister        on Synthroid   Diabetes Mellitus II Sister    Diabetes Mellitus II Maternal Grandfather    Other Maternal Grandfather        Pituitary Mass-benign   Diabetes Mellitus II Paternal Grandmother    Deep vein thrombosis Paternal Grandmother        lifetime anticoagulation   Hypothyroidism Paternal Grandmother    Breast cancer Paternal Aunt  pat great aunt   Cancer Other 50       Breast     Allergies  Allergen Reactions   Penicillins Hives     REVIEW OF SYSTEMS (Negative unless checked)  Constitutional: [] Weight loss  [] Fever  [] Chills Cardiac: [] Chest pain   [] Chest pressure   [] Palpitations   [] Shortness of breath when laying flat   [] Shortness of breath at rest   [] Shortness of breath with exertion. Vascular:  [] Pain in legs with walking   [] Pain in legs at rest   [] Pain in legs when laying flat   [] Claudication   [] Pain in feet when walking  [] Pain in feet at rest  [] Pain in feet when laying flat   [x] History of DVT   [x] Phlebitis   [] Swelling in legs   [] Varicose veins   [] Non-healing ulcers Pulmonary:   [] Uses home oxygen   [] Productive cough   [] Hemoptysis   [] Wheeze  [] COPD   [] Asthma Neurologic:  [] Dizziness  [] Blackouts   [] Seizures   [] History of stroke   [] History of TIA  [] Aphasia   [] Temporary  blindness   [] Dysphagia   [] Weakness or numbness in arms   [] Weakness or numbness in legs Musculoskeletal:  [] Arthritis   [] Joint swelling   [] Joint pain   [] Low back pain Hematologic:  [] Easy bruising  [] Easy bleeding   [] Hypercoagulable state   [] Anemic   Gastrointestinal:  [] Blood in stool   [] Vomiting blood  [] Gastroesophageal reflux/heartburn   [] Abdominal pain Genitourinary:  [] Chronic kidney disease   [] Difficult urination  [] Frequent urination  [] Burning with urination   [] Hematuria Skin:  [] Rashes   [] Ulcers   [] Wounds Psychological:  [] History of anxiety   []  History of major depression.  Physical Examination  BP 126/86   Pulse 74   Resp 16   Ht 5' 5 (1.651 m)   Wt 229 lb 9.6 oz (104.1 kg)   LMP 12/10/2023   BMI 38.21 kg/m  Gen:  WD/WN, NAD Head: [place]/AT, No temporalis wasting. Ear/Nose/Throat: Hearing grossly intact, nares w/o erythema or drainage Eyes: Conjunctiva clear. Sclera non-icteric Neck: Supple.  Trachea midline Pulmonary:  Good air movement, no use of accessory muscles.  Cardiac: RRR, no JVD Vascular:  Vessel Right Left  Radial Palpable Palpable                          PT Palpable Palpable  DP Palpable Palpable   Gastrointestinal: soft, non-tender/non-distended. No guarding/reflex.  Musculoskeletal: M/S 5/5 throughout.  No deformity or atrophy. No edema. Neurologic: Sensation grossly intact in extremities.  Symmetrical.  Speech is fluent.  Psychiatric: Judgment intact, Mood & affect appropriate for pt's clinical situation. Dermatologic: No rashes or ulcers noted.  No cellulitis or open wounds.      Labs No results found for this or any previous visit (from the past 2160 hours).  Radiology No results found.  Assessment/Plan  Pulmonary emboli (HCC) We discussed options including coming off of anticoagulation which I would not favor.  I think either doing full dose anticoagulation or a decreased/prophylactic dose of Eliquis  would be  reasonable.  Given her hormone replacement therapy, her frequent travel, and her previous history of clots she would prefer to stay on full dose anticoagulation which I find very reasonable.  A new prescription will be sent in today.  I will see her back in 1 year.  History of DVT (deep vein thrombosis) We discussed options including coming off of anticoagulation which I would  not favor.  I think either doing full dose anticoagulation or a decreased/prophylactic dose of Eliquis  would be reasonable.  Given her hormone replacement therapy, her frequent travel, and her previous history of clots she would prefer to stay on full dose anticoagulation which I find very reasonable.  A new prescription will be sent in today.  I will see her back in 1 year.    Selinda Gu, MD  01/04/2024 1:05 PM    This note was created with Dragon medical transcription system.  Any errors from dictation are purely unintentional

## 2024-01-04 NOTE — Assessment & Plan Note (Signed)
 We discussed options including coming off of anticoagulation which I would not favor.  I think either doing full dose anticoagulation or a decreased/prophylactic dose of Eliquis  would be reasonable.  Given her hormone replacement therapy, her frequent travel, and her previous history of clots she would prefer to stay on full dose anticoagulation which I find very reasonable.  A new prescription will be sent in today.  I will see her back in 1 year.

## 2024-01-11 ENCOUNTER — Ambulatory Visit (INDEPENDENT_AMBULATORY_CARE_PROVIDER_SITE_OTHER): Payer: Self-pay | Admitting: Family Medicine

## 2024-01-11 ENCOUNTER — Encounter: Payer: Self-pay | Admitting: Family Medicine

## 2024-01-11 VITALS — BP 112/60 | HR 73 | Temp 98.4°F | Ht 65.0 in | Wt 233.0 lb

## 2024-01-11 DIAGNOSIS — D6851 Activated protein C resistance: Secondary | ICD-10-CM | POA: Insufficient documentation

## 2024-01-11 DIAGNOSIS — Z0000 Encounter for general adult medical examination without abnormal findings: Secondary | ICD-10-CM | POA: Diagnosis not present

## 2024-01-11 DIAGNOSIS — E7849 Other hyperlipidemia: Secondary | ICD-10-CM | POA: Diagnosis not present

## 2024-01-11 DIAGNOSIS — E669 Obesity, unspecified: Secondary | ICD-10-CM

## 2024-01-11 DIAGNOSIS — K219 Gastro-esophageal reflux disease without esophagitis: Secondary | ICD-10-CM | POA: Diagnosis not present

## 2024-01-11 DIAGNOSIS — R7303 Prediabetes: Secondary | ICD-10-CM

## 2024-01-12 NOTE — Progress Notes (Signed)
 Annual Physical Exam Visit  Patient Information:  Patient ID: Jenna Mccarty, female DOB: September 09, 1975 Age: 48 y.o. MRN: [id number]   Subjective:   CC: Annual Physical Exam  HPI:  Jenna Mccarty is here for their annual physical.  I reviewed the past medical history, family history, social history, surgical history, and allergies today and changes were made as necessary.  Please see the problem list section below for additional details.  Past Medical History: Past Medical History:  Diagnosis Date   Cervical dysplasia    DVT (deep venous thrombosis) (HCC)    Exposure to severe acute respiratory syndrome coronavirus 2 (SARS-CoV-2) 07/27/2019   Factor V Leiden    GERD (gastroesophageal reflux disease)    Hx of dysplastic nevus ~2018   L med ankle, txted by Dr. Arlyss   Hx of dysplastic nevus ~2018   L vulva, txted by Dr. Arlyss   Hx of dysplastic nevus    L upper abdomen, txed by Dr. Arlyss   Pulmonary embolism [facility]) 12/2022   Past Surgical History: Past Surgical History:  Procedure Laterality Date   COLPOSCOPY  2002   KNEE SURGERY Left 1996   ACL reconstruction - patella tendon graft   PULMONARY THROMBECTOMY N/A 12/29/2022   Procedure: PULMONARY THROMBECTOMY;  Surgeon: Marea Selinda RAMAN, MD;  Location: [facility] INVASIVE CV LAB;  Service: Cardiovascular;  Laterality: N/A;   TONSILLECTOMY     WISDOM TOOTH EXTRACTION     Family History: Family History  Problem Relation Age of Onset   Diabetes Mellitus II Mother    Other Mother 81       Pituitary Mass-benign   Thyroid disease Sister        on Synthroid   Diabetes Mellitus II Sister    Diabetes Mellitus II Maternal Grandfather    Other Maternal Grandfather        Pituitary Mass-benign   Diabetes Mellitus II Paternal Grandmother    Deep vein thrombosis Paternal Grandmother        lifetime anticoagulation   Hypothyroidism Paternal Grandmother    Breast cancer Paternal Aunt        pat great aunt   Cancer  Other 44       Breast   Allergies: Allergies  Allergen Reactions   Penicillins Hives   Health Maintenance: Health Maintenance  Topic Date Due   Colonoscopy  Never done   COVID-19 Vaccine (1) 01/27/2024 (Originally 01/02/1983)   Influenza Vaccine  05/16/2024 (Originally 09/17/2023)   Pneumococcal Vaccine (1 of 2 - PCV) 01/10/2025 (Originally 01/01/1997)   Hepatitis B Vaccines 19-59 Average Risk (1 of 3 - 19+ 3-dose series) 01/10/2025 (Originally 01/01/1997)   Mammogram  08/09/2025   Cervical Cancer Screening (HPV/Pap Cotest)  05/24/2028   DTaP/Tdap/Td (2 - Td or Tdap) 01/06/2033   Hepatitis C Screening  Completed   HIV Screening  Completed   HPV VACCINES  Aged Out   Meningococcal B Vaccine  Aged Out    HM Colonoscopy          Current Care Gaps     Colonoscopy (Every 10 Years) Never done   No completion history exists for this topic.                Medications: Current Outpatient Medications on File Prior to Visit  Medication Sig Dispense Refill   ELIQUIS  5 MG TABS tablet TAKE 1 TABLET(5 MG) BY MOUTH TWICE DAILY 60 tablet 2   Multiple Vitamins-Minerals (MULTIVITAMIN PO) Take by  mouth.     norethindrone  (MICRONOR ) 0.35 MG tablet Take 1 tablet (0.35 mg total) by mouth daily. 84 tablet 3   omeprazole  (PRILOSEC) 20 MG capsule Take 1 capsule (20 mg total) by mouth daily. 90 capsule 3   No current facility-administered medications on file prior to visit.    Discussed the use of AI scribe software for clinical note transcription with the patient, who gave verbal consent to proceed.   Objective:   Vitals:   01/11/24 0902  BP: 112/60  Pulse: 73  Temp: 98.4 F (36.9 C)  SpO2: 94%   Vitals:   01/11/24 0902  Weight: 233 lb (105.7 kg)  Height: 5' 5 (1.651 m)   Body mass index is 38.77 kg/m.  General: Well Developed, well nourished, and in no acute distress.  Neuro: Alert and oriented x3, extra-ocular muscles intact, sensation grossly intact. Cranial nerves II  through XII are grossly intact, motor, sensory, and coordinative functions are intact. HEENT: Normocephalic, atraumatic, neck supple, no masses, no lymphadenopathy, thyroid nonenlarged. Oropharynx, nasopharynx, external ear canals are unremarkable. Skin: Warm and dry, no rashes noted.  Cardiac: Regular rate and rhythm, no murmurs rubs or gallops. No peripheral edema. Pulses symmetric. Respiratory: Clear to auscultation bilaterally. Speaking in full sentences.  Abdominal: Soft, nontender, nondistended, positive bowel sounds, no masses, no organomegaly. Musculoskeletal: Stable, and with full range of motion.   Impression and Recommendations:   History of Present Illness Jenna Mccarty is a 47 year old female who presents for a routine follow-up and lab work.  Thromboembolic risk and anticoagulation - Factor V Leiden mutation with ongoing management by vascular group - Continues on blood thinners due to frequent air travel (nine flights between September 15th and November 15th) - Has chosen not to reduce blood thinner dosage  Gastroesophageal reflux disease (gerd) symptoms - Evening reflux symptoms persist despite morning medication administration - Red wine exacerbates reflux - Dinner typically at 5:30-6:00 PM; bedtime at 8:30-9:00 PM  Oral contraceptive use - Currently taking norethindrone  (Micronor ) - Recent Pap smear was clear  Hyperlipidemia and metabolic monitoring - Cholesterol levels monitored - Previous labs showed elevated liver enzymes and slight increase in A1c  Iron deficiency and supplementation - Takes iron supplement every other day with vitamin C - Iron levels have improved since June and are now within normal range  Colorectal cancer screening - Has not completed colon cancer screening due to initiation of blood thinners last year  Dietary habits and stress eating - Stress eating occurs, particularly during work-related stress  Physical Exam VITALS:  BP- 112/60 MEASUREMENTS: Weight- 229. NECK: No cervical lymphadenopathy or thyromegaly. CHEST: Lungs clear to auscultation bilaterally. CARDIOVASCULAR: Regular rate and rhythm, no murmurs. ABDOMEN: Normal bowel sounds, no tenderness.  Assessment and Plan General adult medical examination Routine examination with well-controlled blood pressure and normal recent mammogram. Pap smear clear, annual follow-up recommended. Colon cancer screening pending. - Ordered labs for cholesterol, A1c, and liver function. - Referred for colonoscopy. - Scheduled follow-up in one year for physical examination.  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 Decision to maintain current regimen to prevent clotting risks. - Continue current anticoagulation regimen.  Gastroesophageal reflux disease Reflux symptoms managed with daily medication, exacerbated by red wine. Current regimen effective, discussed potential nutrient absorption issues. - Continue current reflux medication. - Monitor for nutrient absorption issues.  Hyperlipidemia Previous labs showed elevated cholesterol. Discussed dietary modifications  and potential weight loss medications if cholesterol remains elevated. Considered GLP-1 agonists for weight loss and cholesterol management, contingent on insurance coverage. - Ordered labs to check cholesterol levels. - Discussed dietary modifications to manage cholesterol. - Will consider weight loss medications if cholesterol remains elevated.  Prediabetes Previous labs indicated prediabetes with trending A1c levels. Discussed dietary modifications and potential weight loss medications if A1c remains elevated. Considered GLP-1 agonists for weight loss and glycemic control, contingent on insurance coverage. - Ordered labs to check A1c levels. - Discussed dietary modifications to improve  glycemic control. - Will consider weight loss medications if A1c levels remain elevated.  Iron deficiency anemia on supplementation Iron levels improving with current supplementation regimen. Last check showed levels returning to normal range. - Continue current iron supplementation regimen.  Obesity Weight at 229 lbs. Discussed stress eating and potential weight loss medications if indicated by lab results. Considered GLP-1 agonists for weight loss, contingent on insurance coverage. Discussed alternative indications for weight loss medications, including fatty liver and sleep apnea. - Ordered labs to assess current health status. - Will consider weight loss medications if indicated by lab results. - Discussed potential use of GLP-1 agonists for weight loss, contingent on insurance coverage.  The patient was counselled, risk factors were discussed, and anticipatory guidance given.  Problem List Items Addressed This Visit     Factor V Leiden   Gastroesophageal reflux disease without esophagitis   Healthcare maintenance - Primary   Relevant Orders   CBC   Comprehensive metabolic panel with GFR   Hemoglobin A1c   Lipid panel   Hyperlipidemia   Obesity (BMI 30-39.9)   Prediabetes     Orders & Medications Medications: No orders of the defined types were placed in this encounter.  Orders Placed This Encounter  Procedures   CBC   Comprehensive metabolic panel with GFR   Hemoglobin A1c   Lipid panel     Return in about 1 year (around 01/10/2025) for CPE.    Selinda JINNY Ku, MD, [facility]   Primary Care Sports Medicine Primary Care and Sports Medicine at [facility]

## 2024-01-12 NOTE — Patient Instructions (Signed)
-   Obtain fasting labs with orders provided (can have water or black coffee but otherwise no food or drink x 8 hours before labs) - Review information provided - Attend eye doctor annually, dentist every 6 months, work towards or maintain 30 minutes of moderate intensity physical activity at least 5 days per week, and consume a balanced diet - Return in 1 year for physical - Contact us  for any questions between now and then   VISIT SUMMARY:  Today, you had a routine follow-up and lab work. We discussed your ongoing management for Factor V Leiden, GERD symptoms, oral contraceptive use, hyperlipidemia, prediabetes, iron deficiency, and obesity. We also planned for your colon cancer screening and reviewed your dietary habits and stress eating.  YOUR PLAN:  FACTOR V LEIDEN AND ANTICOAGULATION: You have a Factor V Leiden mutation and are on blood thinners due to frequent air travel. -Continue your current anticoagulation regimen.  GASTROESOPHAGEAL REFLUX DISEASE (GERD): You experience evening reflux symptoms, which are worsened by red wine. -Continue your current reflux medication. -Monitor for any nutrient absorption issues.  HYPERLIPIDEMIA: Your previous labs showed elevated cholesterol levels. -We ordered labs to check your cholesterol levels. -Discussed dietary modifications to manage your cholesterol. -We will consider weight loss medications if your cholesterol remains elevated.  PREDIABETES: Your previous labs indicated prediabetes with trending A1c levels. -We ordered labs to check your A1c levels. -Discussed dietary modifications to improve your glycemic control. -We will consider weight loss medications if your A1c levels remain elevated.  IRON DEFICIENCY ANEMIA: Your iron levels are improving with your current supplementation regimen. -Continue your current iron supplementation regimen.  OBESITY: Your weight is currently at 229 lbs. We discussed stress eating and potential weight  loss medications. -We ordered labs to assess your current health status. -We will consider weight loss medications if indicated by lab results. -Discussed potential use of GLP-1 agonists for weight loss, contingent on insurance coverage.

## 2024-01-15 ENCOUNTER — Other Ambulatory Visit: Payer: Self-pay | Admitting: Oncology

## 2024-01-28 ENCOUNTER — Ambulatory Visit: Payer: Self-pay | Admitting: Family Medicine

## 2024-01-28 LAB — COMPREHENSIVE METABOLIC PANEL WITH GFR
ALT: 19 IU/L (ref 0–32)
AST: 22 IU/L (ref 0–40)
Albumin: 4.5 g/dL (ref 3.9–4.9)
Alkaline Phosphatase: 90 IU/L (ref 41–116)
BUN/Creatinine Ratio: 21 (ref 9–23)
BUN: 17 mg/dL (ref 6–24)
Bilirubin Total: 0.3 mg/dL (ref 0.0–1.2)
CO2: 21 mmol/L (ref 20–29)
Calcium: 9.2 mg/dL (ref 8.7–10.2)
Chloride: 103 mmol/L (ref 96–106)
Creatinine, Ser: 0.81 mg/dL (ref 0.57–1.00)
Globulin, Total: 2.4 g/dL (ref 1.5–4.5)
Glucose: 101 mg/dL — ABNORMAL HIGH (ref 70–99)
Potassium: 4.7 mmol/L (ref 3.5–5.2)
Sodium: 138 mmol/L (ref 134–144)
Total Protein: 6.9 g/dL (ref 6.0–8.5)
eGFR: 91 mL/min/1.73 (ref >59)

## 2024-01-28 LAB — LIPID PANEL
Chol/HDL Ratio: 5.8 ratio — ABNORMAL HIGH (ref 0.0–4.4)
Cholesterol, Total: 244 mg/dL — ABNORMAL HIGH (ref 100–199)
HDL: 42 mg/dL (ref >39)
LDL Chol Calc (NIH): 183 mg/dL — ABNORMAL HIGH (ref 0–99)
Triglycerides: 108 mg/dL (ref 0–149)
VLDL Cholesterol Cal: 19 mg/dL (ref 5–40)

## 2024-01-28 LAB — CBC
Hematocrit: 42.7 % (ref 34.0–46.6)
Hemoglobin: 13.8 g/dL (ref 11.1–15.9)
MCH: 31.5 pg (ref 26.6–33.0)
MCHC: 32.3 g/dL (ref 31.5–35.7)
MCV: 98 fL — ABNORMAL HIGH (ref 79–97)
Platelets: 284 x10E3/uL (ref 150–450)
RBC: 4.38 x10E6/uL (ref 3.77–5.28)
RDW: 12.1 % (ref 11.7–15.4)
WBC: 6.6 x10E3/uL (ref 3.4–10.8)

## 2024-01-28 LAB — HEMOGLOBIN A1C
Est. average glucose Bld gHb Est-mCnc: 114 mg/dL
Hgb A1c MFr Bld: 5.6 % (ref 4.8–5.6)

## 2024-02-06 ENCOUNTER — Encounter: Payer: Self-pay | Admitting: Oncology

## 2024-02-06 DIAGNOSIS — I82412 Acute embolism and thrombosis of left femoral vein: Secondary | ICD-10-CM

## 2024-02-07 ENCOUNTER — Other Ambulatory Visit: Payer: Self-pay | Admitting: *Deleted

## 2024-02-07 DIAGNOSIS — I2699 Other pulmonary embolism without acute cor pulmonale: Secondary | ICD-10-CM

## 2024-02-07 DIAGNOSIS — I82412 Acute embolism and thrombosis of left femoral vein: Secondary | ICD-10-CM

## 2024-02-08 ENCOUNTER — Inpatient Hospital Stay: Attending: Oncology

## 2024-02-08 DIAGNOSIS — N920 Excessive and frequent menstruation with regular cycle: Secondary | ICD-10-CM | POA: Diagnosis not present

## 2024-02-08 DIAGNOSIS — I82412 Acute embolism and thrombosis of left femoral vein: Secondary | ICD-10-CM

## 2024-02-08 DIAGNOSIS — Z7901 Long term (current) use of anticoagulants: Secondary | ICD-10-CM | POA: Insufficient documentation

## 2024-02-08 DIAGNOSIS — I2699 Other pulmonary embolism without acute cor pulmonale: Secondary | ICD-10-CM | POA: Diagnosis present

## 2024-02-08 LAB — IRON AND TIBC
Iron: 85 ug/dL (ref 28–170)
Saturation Ratios: 24 % (ref 10.4–31.8)
TIBC: 358 ug/dL (ref 250–450)
UIBC: 273 ug/dL

## 2024-02-08 LAB — CBC WITH DIFFERENTIAL/PLATELET
Abs Immature Granulocytes: 0.02 K/uL (ref 0.00–0.07)
Basophils Absolute: 0 K/uL (ref 0.0–0.1)
Basophils Relative: 1 %
Eosinophils Absolute: 0.1 K/uL (ref 0.0–0.5)
Eosinophils Relative: 2 %
HCT: 39.5 % (ref 36.0–46.0)
Hemoglobin: 13.1 g/dL (ref 12.0–15.0)
Immature Granulocytes: 0 %
Lymphocytes Relative: 31 %
Lymphs Abs: 1.9 K/uL (ref 0.7–4.0)
MCH: 31.4 pg (ref 26.0–34.0)
MCHC: 33.2 g/dL (ref 30.0–36.0)
MCV: 94.7 fL (ref 80.0–100.0)
Monocytes Absolute: 0.4 K/uL (ref 0.1–1.0)
Monocytes Relative: 7 %
Neutro Abs: 3.6 K/uL (ref 1.7–7.7)
Neutrophils Relative %: 59 %
Platelets: 297 K/uL (ref 150–400)
RBC: 4.17 MIL/uL (ref 3.87–5.11)
RDW: 12.1 % (ref 11.5–15.5)
WBC: 6.1 K/uL (ref 4.0–10.5)
nRBC: 0 % (ref 0.0–0.2)

## 2024-02-08 LAB — FERRITIN: Ferritin: 62 ng/mL (ref 11–307)

## 2024-02-08 LAB — D-DIMER, QUANTITATIVE: D-Dimer, Quant: 0.33 ug{FEU}/mL (ref 0.00–0.50)

## 2024-02-15 ENCOUNTER — Other Ambulatory Visit: Payer: Self-pay

## 2024-02-15 DIAGNOSIS — K219 Gastro-esophageal reflux disease without esophagitis: Secondary | ICD-10-CM

## 2024-02-15 MED ORDER — OMEPRAZOLE 20 MG PO CPDR: 20.0000 mg | DELAYED_RELEASE_CAPSULE | Freq: Every day | ORAL | 3 refills | Status: AC

## 2024-03-21 ENCOUNTER — Ambulatory Visit

## 2024-03-21 ENCOUNTER — Inpatient Hospital Stay: Admission: RE | Admit: 2024-03-21 | Discharge: 2024-03-21 | Payer: Self-pay | Attending: Family Medicine

## 2024-03-21 VITALS — BP 126/86 | HR 78 | Temp 99.1°F | Resp 16

## 2024-03-21 DIAGNOSIS — W19XXXA Unspecified fall, initial encounter: Secondary | ICD-10-CM

## 2024-03-21 DIAGNOSIS — Z7901 Long term (current) use of anticoagulants: Secondary | ICD-10-CM | POA: Diagnosis not present

## 2024-03-21 DIAGNOSIS — R519 Headache, unspecified: Secondary | ICD-10-CM

## 2024-03-21 NOTE — ED Triage Notes (Signed)
 Pt presents to UC d/t fall on ice Saturday. States hit the back of her head. Currently on eliquis  5 mg BID. Denies LOC,blurred vision or photophobia. Has minor HA currently.

## 2024-03-21 NOTE — Discharge Instructions (Signed)
 Our head CT was normal. There was no brain bleed or skull fracture seen.  You can take Tylenol 1000 mg every 6 hours as needed for headache.    Go to ED for red flag symptoms, including; fevers you cannot reduce with Tylenol/Motrin, severe headaches, vision changes, numbness/weakness in part of the body, lethargy, confusion, intractable vomiting, severe dehydration, chest pain, breathing difficulty, severe persistent abdominal or pelvic pain, signs of severe infection (increased redness, swelling of an area), feeling faint or passing out, dizziness, etc. You should especially go to the ED for sudden acute worsening of condition if you do not elect to go at this time.

## 2024-03-21 NOTE — ED Notes (Signed)
 Called [employer] in regards to PA for CT head WO contrast. S/w Brittany C & she stated no PA required.  Ref #:[id number]

## 2024-03-21 NOTE — ED Provider Notes (Signed)
 " [facility]    CSN: [id number] Arrival date & time: 03/21/24  9148      History   Chief Complaint Chief Complaint  Patient presents with   Fall    I just want to get checked after a fall.  I feel ok. - Entered by patient    HPI Shriya Aker is a 49 y.o. female.   HPI   Weslynn presents after a fall that occurred on Saturday.  She thinks she hit her head.  She takes Eliquis  and has a dull headache.  She was able to get up by herself.  There was no loss of consciousness or vomiting.  She feels okay said the brunt of the weight was on her but an arm.  No focal weakness, blurry vision, disorientation.  Takes Eliquis  for factor V with history of DVT and PE.     Past Medical History:  Diagnosis Date   Cervical dysplasia    DVT (deep venous thrombosis) (HCC)    Exposure to severe acute respiratory syndrome coronavirus 2 (SARS-CoV-2) 07/27/2019   Factor V Leiden    GERD (gastroesophageal reflux disease)    Hx of dysplastic nevus ~2018   L med ankle, txted by Dr. Arlyss   Hx of dysplastic nevus ~2018   L vulva, txted by Dr. Arlyss   Hx of dysplastic nevus    L upper abdomen, txed by Dr. Arlyss   Pulmonary embolism [facility]) 12/2022    Patient Active Problem List   Diagnosis Date Noted   Factor V Leiden    DVT (deep venous thrombosis) (HCC) 01/04/2024   History of DVT (deep vein thrombosis) 01/04/2024   Healthcare maintenance 01/07/2023   Gastroesophageal reflux disease without esophagitis 01/07/2023   Prediabetes 01/07/2023   Hyperlipidemia 01/07/2023   Cervical dysplasia    Acute deep vein thrombosis (DVT) of femoral vein of left lower extremity (HCC) 12/29/2022   Myocardial injury 12/29/2022   Obesity (BMI 30-39.9) 12/29/2022   Pulmonary emboli (HCC) 12/28/2022   Contraception management 12/28/2022    Past Surgical History:  Procedure Laterality Date   COLPOSCOPY  2002   KNEE SURGERY Left 1996   ACL reconstruction - patella tendon graft    PULMONARY THROMBECTOMY N/A 12/29/2022   Procedure: PULMONARY THROMBECTOMY;  Surgeon: Marea Selinda RAMAN, MD;  Location: [facility] INVASIVE CV LAB;  Service: Cardiovascular;  Laterality: N/A;   TONSILLECTOMY     WISDOM TOOTH EXTRACTION      OB History     Gravida  0   Para  0   Term  0   Preterm  0   AB  0   Living  0      SAB  0   IAB  0   Ectopic  0   Multiple  0   Live Births  0            Home Medications    Prior to Admission medications  Medication Sig Start Date End Date Taking? Authorizing Provider  ELIQUIS  5 MG TABS tablet TAKE 1 TABLET(5 MG) BY MOUTH TWICE DAILY 01/17/24   Rao, Archana C, MD  Multiple Vitamins-Minerals (MULTIVITAMIN PO) Take by mouth.    [provider]  norethindrone  (MICRONOR ) 0.35 MG tablet Take 1 tablet (0.35 mg total) by mouth daily. 05/25/23   Copland, Alicia B, PA-C  omeprazole  (PRILOSEC) 20 MG capsule Take 1 capsule (20 mg total) by mouth daily. 02/15/24   Alvia Selinda PARAS, MD    Family  History Family History  Problem Relation Age of Onset   Diabetes Mellitus II Mother    Other Mother 24       Pituitary Mass-benign   Thyroid disease Sister        on Synthroid   Diabetes Mellitus II Sister    Diabetes Mellitus II Maternal Grandfather    Other Maternal Grandfather        Pituitary Mass-benign   Diabetes Mellitus II Paternal Grandmother    Deep vein thrombosis Paternal Grandmother        lifetime anticoagulation   Hypothyroidism Paternal Grandmother    Breast cancer Paternal Aunt        pat great aunt   Cancer Other 74       Breast    Social History Social History[1]   Allergies   Penicillins   Review of Systems Review of Systems: negative unless otherwise stated in HPI.      Physical Exam Triage Vital Signs ED Triage Vitals  Encounter Vitals Group     BP 03/21/24 0921 126/86     Girls Systolic BP Percentile --      Girls Diastolic BP Percentile --      Boys Systolic BP Percentile --      Boys  Diastolic BP Percentile --      Pulse Rate 03/21/24 0921 78     Resp 03/21/24 0921 16     Temp 03/21/24 0921 99.1 F (37.3 C)     Temp Source 03/21/24 0921 Oral     SpO2 03/21/24 0921 96 %     Weight --      Height --      Head Circumference --      Peak Flow --      Pain Score 03/21/24 0920 1     Pain Loc --      Pain Education --      Exclude from Growth Chart --    No data found.  Updated Vital Signs BP 126/86 (BP Location: Right Arm)   Pulse 78   Temp 99.1 F (37.3 C) (Oral)   Resp 16   LMP 03/21/2024   SpO2 96%   Visual Acuity Right Eye Distance:   Left Eye Distance:   Bilateral Distance:    Right Eye Near:   Left Eye Near:    Bilateral Near:     Physical Exam GEN: Alert, female in no acute distress  EYES: Extraocular movements intact, pupils equal round and reactive to light HENT: Moist mucous membranes, no oropharyngeal lesions, no blood visble, no hemotympanum on right, left TM not visible due to cerumen impaction, no hematoma NECK: Normal range of motion, no cervical spinous tenderness,  no paraspinal tenderness bilaterally CV: regular rate and rhythm RESP: no increased work of breathing, clear to ascultation bilaterally MSK: No extremity edema or deformities Thoracic and lumbar spine:  no spinous process tenderness and paraspinal tenderness bilaterally pelvis stable SKIN: warm, dry, no abrasions NEURO: alert, moves all extremities appropriately, strength 5/5 bilateral upper and lower extremities, alert and oriented, normal speech      UC Treatments / Results  Labs (all labs ordered are listed, but only abnormal results are displayed) Labs Reviewed - No data to display  EKG   Radiology CT Head Wo Contrast Result Date: 03/21/2024 EXAM: CT HEAD WITHOUT CONTRAST 03/21/2024 10:21:21 AM TECHNIQUE: CT of the head was performed without the administration of intravenous contrast. Automated exposure control, iterative reconstruction, and/or weight based  adjustment of  the mA/kV was utilized to reduce the radiation dose to as low as reasonably achievable. COMPARISON: None available. CLINICAL HISTORY: Headache, new onset (Age >= 51y). FINDINGS: BRAIN AND VENTRICLES: No acute hemorrhage. No evidence of acute infarct. No hydrocephalus. No extra-axial collection. No mass effect or midline shift. ORBITS: No acute abnormality. SINUSES: No acute abnormality. SOFT TISSUES AND SKULL: No acute soft tissue abnormality. No skull fracture. IMPRESSION: 1. No acute intracranial abnormality. Electronically signed by: Evalene Coho MD 03/21/2024 10:23 AM EST RP Workstation: [id number]    Procedures Procedures (including critical care time)  Medications Ordered in UC Medications - No data to display  Initial Impression / Assessment and Plan / UC Course  I have reviewed the triage vital signs and the nursing notes.  Pertinent labs & imaging results that were available during my care of the patient were reviewed by me and considered in my medical decision making (see chart for details).       Patient is a 49 y.o. female who presents for head injury that occurred 4 days ago when she slipped on the ice at home.  VSS. Her neuro exam is completely normal.  I do not suspect patient has a concussion.  She has no head wounds, scalp deformities or hematomas palpable.  Given history of chronic anticoagulation with Eliquis  and new headache a CT Head was obtained. On my review, no skull fractures or intracranial bleeds seen.  Radiologist impression reviewed and notes no acute abnormality.       ED and return precautions given and patient/guardian voiced understanding. Discussed MDM, treatment plan and plan for follow-up with patient/parent who agrees with plan.    Time based billing statement Performed by: Caprice Porteous, DO    Total time: 47 minutes  Time was exclusive of separately billable procedures and treating other patients.   Time spent personally by me  on the following activities: reviewing records, development of treatment plan with patient and/or surrogate, discussions with consultants/peer-to-peer conversations with insurance, evaluation of patient's response to treatment, examination of patient, obtaining history from patient or surrogate, ordering and performing treatments and interventions outside of separately billable procedures, ordering and review of laboratory studies, ordering and review of radiographic studies, pulse oximetry, coordinating care, and re-evaluation of patient's condition.     Final Clinical Impressions(s) / UC Diagnoses   Final diagnoses:  Acute nonintractable headache, unspecified headache type  Chronic anticoagulation  Fall, initial encounter     Discharge Instructions      Our head CT was normal. There was no brain bleed or skull fracture seen.  You can take Tylenol 1000 mg every 6 hours as needed for headache.    Go to ED for red flag symptoms, including; fevers you cannot reduce with Tylenol/Motrin, severe headaches, vision changes, numbness/weakness in part of the body, lethargy, confusion, intractable vomiting, severe dehydration, chest pain, breathing difficulty, severe persistent abdominal or pelvic pain, signs of severe infection (increased redness, swelling of an area), feeling faint or passing out, dizziness, etc. You should especially go to the ED for sudden acute worsening of condition if you do not elect to go at this time.       ED Prescriptions   None    PDMP not reviewed this encounter.     [1]  Social History Tobacco Use   Smoking status: Never   Smokeless tobacco: Never  Vaping Use   Vaping status: Never Used  Substance Use Topics   Alcohol use: Yes    Comment: social  Drug use: No     Kriste Berth, DO 03/21/24 1030  "

## 2024-08-07 ENCOUNTER — Encounter: Admitting: Dermatology

## 2024-08-08 ENCOUNTER — Ambulatory Visit: Admitting: Oncology

## 2024-08-08 ENCOUNTER — Other Ambulatory Visit

## 2025-01-02 ENCOUNTER — Ambulatory Visit (INDEPENDENT_AMBULATORY_CARE_PROVIDER_SITE_OTHER): Admitting: Vascular Surgery

## 2025-01-15 ENCOUNTER — Encounter: Admitting: Family Medicine
